# Patient Record
Sex: Female | Born: 1935 | Race: White | Hispanic: No | Smoking: Never smoker
Health system: Southern US, Community
[De-identification: ages and names within clinical notes are randomized; demographics above are authoritative.]

## PROBLEM LIST (undated history)

## (undated) DIAGNOSIS — M47817 Spondylosis without myelopathy or radiculopathy, lumbosacral region: Secondary | ICD-10-CM

## (undated) DIAGNOSIS — I10 Essential (primary) hypertension: Secondary | ICD-10-CM

## (undated) DIAGNOSIS — I959 Hypotension, unspecified: Secondary | ICD-10-CM

## (undated) DIAGNOSIS — K449 Diaphragmatic hernia without obstruction or gangrene: Secondary | ICD-10-CM

## (undated) DIAGNOSIS — I351 Nonrheumatic aortic (valve) insufficiency: Secondary | ICD-10-CM

## (undated) DIAGNOSIS — I340 Nonrheumatic mitral (valve) insufficiency: Secondary | ICD-10-CM

## (undated) DIAGNOSIS — Z860100 Personal history of colon polyps, unspecified: Secondary | ICD-10-CM

## (undated) DIAGNOSIS — R918 Other nonspecific abnormal finding of lung field: Secondary | ICD-10-CM

## (undated) DIAGNOSIS — M810 Age-related osteoporosis without current pathological fracture: Secondary | ICD-10-CM

## (undated) DIAGNOSIS — E785 Hyperlipidemia, unspecified: Secondary | ICD-10-CM

## (undated) DIAGNOSIS — D509 Iron deficiency anemia, unspecified: Secondary | ICD-10-CM

## (undated) DIAGNOSIS — M069 Rheumatoid arthritis, unspecified: Secondary | ICD-10-CM

## (undated) DIAGNOSIS — I2510 Atherosclerotic heart disease of native coronary artery without angina pectoris: Secondary | ICD-10-CM

## (undated) DIAGNOSIS — I110 Hypertensive heart disease with heart failure: Secondary | ICD-10-CM

## (undated) DIAGNOSIS — I509 Heart failure, unspecified: Secondary | ICD-10-CM

## (undated) DIAGNOSIS — F32A Depression, unspecified: Secondary | ICD-10-CM

## (undated) DIAGNOSIS — F329 Major depressive disorder, single episode, unspecified: Secondary | ICD-10-CM

## (undated) DIAGNOSIS — E876 Hypokalemia: Secondary | ICD-10-CM

## (undated) DIAGNOSIS — E871 Hypo-osmolality and hyponatremia: Secondary | ICD-10-CM

## (undated) DIAGNOSIS — I5030 Unspecified diastolic (congestive) heart failure: Secondary | ICD-10-CM

## (undated) DIAGNOSIS — K219 Gastro-esophageal reflux disease without esophagitis: Secondary | ICD-10-CM

## (undated) DIAGNOSIS — M19049 Primary osteoarthritis, unspecified hand: Secondary | ICD-10-CM

## (undated) HISTORY — DX: Gastro-esophageal reflux disease without esophagitis: K21.9

## (undated) HISTORY — PX: LAMINECTOMY: SHX219

## (undated) HISTORY — DX: Unspecified diastolic (congestive) heart failure: I50.30

## (undated) HISTORY — DX: Nonrheumatic aortic (valve) insufficiency: I35.1

## (undated) HISTORY — PX: TOTAL ABDOMINAL HYSTERECTOMY: SHX209

## (undated) HISTORY — DX: Iron deficiency anemia, unspecified: D50.9

## (undated) HISTORY — DX: Age-related osteoporosis without current pathological fracture: M81.0

## (undated) HISTORY — DX: Hypertensive heart disease with heart failure: I11.0

## (undated) HISTORY — DX: Personal history of colonic polyps: Z86.010

## (undated) HISTORY — DX: Depression, unspecified: F32.A

## (undated) HISTORY — DX: Hypo-osmolality and hyponatremia: E87.1

## (undated) HISTORY — DX: Nonrheumatic mitral (valve) insufficiency: I34.0

## (undated) HISTORY — DX: Rheumatoid arthritis, unspecified: M06.9

## (undated) HISTORY — PX: CHOLECYSTECTOMY: SHX55

## (undated) HISTORY — DX: Other nonspecific abnormal finding of lung field: R91.8

## (undated) HISTORY — DX: Hypotension, unspecified: I95.9

## (undated) HISTORY — DX: Essential (primary) hypertension: I10

## (undated) HISTORY — PX: APPENDECTOMY: SHX54

## (undated) HISTORY — PX: CATARACT EXTRACTION: SUR2

## (undated) HISTORY — DX: Heart failure, unspecified: I50.9

## (undated) HISTORY — DX: Primary osteoarthritis, unspecified hand: M19.049

## (undated) HISTORY — PX: OTHER SURGICAL HISTORY: SHX169

## (undated) HISTORY — DX: Major depressive disorder, single episode, unspecified: F32.9

## (undated) HISTORY — DX: Spondylosis without myelopathy or radiculopathy, lumbosacral region: M47.817

## (undated) HISTORY — DX: Hyperlipidemia, unspecified: E78.5

## (undated) HISTORY — DX: Personal history of colon polyps, unspecified: Z86.0100

## (undated) HISTORY — DX: Diaphragmatic hernia without obstruction or gangrene: K44.9

## (undated) HISTORY — DX: Atherosclerotic heart disease of native coronary artery without angina pectoris: I25.10

## (undated) HISTORY — DX: Hypokalemia: E87.6

---

## 2002-03-08 HISTORY — PX: OTHER SURGICAL HISTORY: SHX169

## 2002-12-10 ENCOUNTER — Inpatient Hospital Stay (HOSPITAL_COMMUNITY): Admission: RE | Admit: 2002-12-10 | Discharge: 2002-12-14 | Payer: Self-pay | Admitting: Neurosurgery

## 2002-12-14 ENCOUNTER — Inpatient Hospital Stay (HOSPITAL_COMMUNITY)
Admission: RE | Admit: 2002-12-14 | Discharge: 2002-12-21 | Payer: Self-pay | Admitting: Physical Medicine & Rehabilitation

## 2004-04-04 ENCOUNTER — Ambulatory Visit (HOSPITAL_COMMUNITY): Admission: RE | Admit: 2004-04-04 | Discharge: 2004-04-04 | Payer: Self-pay | Admitting: Neurosurgery

## 2006-12-10 ENCOUNTER — Encounter: Admission: RE | Admit: 2006-12-10 | Discharge: 2006-12-10 | Payer: Self-pay | Admitting: Neurosurgery

## 2006-12-14 ENCOUNTER — Ambulatory Visit (HOSPITAL_COMMUNITY): Admission: RE | Admit: 2006-12-14 | Discharge: 2006-12-15 | Payer: Self-pay | Admitting: Neurosurgery

## 2008-06-01 ENCOUNTER — Encounter: Admission: RE | Admit: 2008-06-01 | Discharge: 2008-06-01 | Payer: Self-pay | Admitting: Neurosurgery

## 2009-01-18 ENCOUNTER — Encounter: Admission: RE | Admit: 2009-01-18 | Discharge: 2009-01-18 | Payer: Self-pay | Admitting: Rheumatology

## 2009-01-22 ENCOUNTER — Inpatient Hospital Stay (HOSPITAL_COMMUNITY): Admission: RE | Admit: 2009-01-22 | Discharge: 2009-01-23 | Payer: Self-pay | Admitting: Neurosurgery

## 2009-03-06 ENCOUNTER — Ambulatory Visit (HOSPITAL_COMMUNITY): Admission: RE | Admit: 2009-03-06 | Discharge: 2009-03-06 | Payer: Self-pay | Admitting: Neurosurgery

## 2010-06-10 LAB — BASIC METABOLIC PANEL
CO2: 27 mEq/L (ref 19–32)
Calcium: 8.7 mg/dL (ref 8.4–10.5)
Chloride: 105 mEq/L (ref 96–112)
Glucose, Bld: 174 mg/dL — ABNORMAL HIGH (ref 70–99)
Sodium: 140 mEq/L (ref 135–145)

## 2010-06-10 LAB — CBC: MCV: 97.4 fL (ref 78.0–100.0)

## 2010-07-21 NOTE — Op Note (Signed)
[name] Parsons, [name] Parsons                ACCOUNT NO.:  [id number]   MEDICAL RECORD NO.:  [id number]          PATIENT TYPE:  AMB   LOCATION:  SDS                          FACILITY:  [facility]   PHYSICIAN:  Cristi Loron, M.D.DATE OF BIRTH:  12-Sep-1919   DATE OF PROCEDURE:  12/14/2006  DATE OF DISCHARGE:                               OPERATIVE REPORT   BRIEF HISTORY:  The patient is an 77 year old white female who has  suffered from approximately 1 month history of severe back pain after a  fall.  She was worked up with x-rays and MRI scan, which demonstrated  acute L1 compression fracture.  I discussed the various treatment  options with the patient and her daughters including surgery.  The  patient has weighed the risks, benefits, and alternatives of surgery and  desired to proceed with an L1 kyphoplasty.   PREOPERATIVE DIAGNOSES:  L1 compression fracture, lumbago.   POSTOPERATIVE DIAGNOSES:  L1 compression fracture, lumbago.   PROCEDURE:  L1 kyphoplasty.   SURGEON:  Cristi Loron, M.D.   ASSISTANT:  None.   ANESTHESIA:  General endotracheal.   ESTIMATED BLOOD LOSS:  Minimal.   SPECIMENS:  None.   DRAINS:  None.   COMPLICATIONS:  None.   DESCRIPTION OF PROCEDURE:  The patient was brought to the operating by  the Anesthesia team.  General endotracheal anesthesia was induced.  The  patient was carefully turned to the prone position on chest rolls.  The  thoracolumbar area was prepared with DuraPrep.  Sterile drapes were  applied.  I injected the area to be incised with Marcaine with  epinephrine solution.  We used biplanar fluoroscopy to help [place] with  where to make the incisions.  We made approximately 1 cm incisions  bilaterally, just lateral to the midline.  Then, under biplanar  fluoroscopic guidance, we cannulated the pedicles with the bone needle.  We then moved the trocar drill up the vertebral body and then placed the  balloon.  We blew up the balloons to  approximately 3 to 4 mL bilaterally  under fluoroscopic guidance.  We then removed the balloon and after we  had mixed the cement and it had hardened adequately, we then placed  proximally approximately 2.5 mL of the glue bilaterally into the cavity  created by the balloon bilaterally under fluoroscopic guidance carefully  checking for extravasation and my placement of the methyl methacrylate.  After we were satisfied with the kyphoplasty, we then removed the bone  fillers, and we got final x-ray, which looked good.  The patient got a  good reduction of her kyphotic fracture.  We then reapproximated the  patient's subcutaneous tissue with interrupted 3-0 Vicryl suture, the  skin with Steri-Strips and benzoin.  The wound was then  coated with bacitracin ointment and sterile dressing was applied.  Drapes were removed.  The patient was subsequently returned to supine  position and extubated by the Anesthesia team and transported to  postanesthesia care unit in stable condition.  All sponge, instrument,  and needle counts were correct at the end of this case.  Cristi Loron, M.D.  Electronically Signed     JDJ/MEDQ  D:  12/14/2006  T:  12/15/2006  Job:  [id number]

## 2010-07-24 NOTE — Discharge Summary (Signed)
   [name], [name] Parsons                          ACCOUNT NO.:  [id number]   MEDICAL RECORD NO.:  [id number]                   PATIENT TYPE:  INP   LOCATION:  3008                                 FACILITY:  [facility]   PHYSICIAN:  Cristi Loron, M.D.            DATE OF BIRTH:  1919/04/15   DATE OF ADMISSION:  12/10/2002  DATE OF DISCHARGE:  12/14/2002                                 DISCHARGE SUMMARY   For full details of this admission please refer to typed History and  Physical.   BRIEF HISTORY:  The patient is an 77 year old white female who suffers from  back and intractable leg pain consistent with neurogenic claudication.  She  failed medical management and was worked up with a lumbar MRI which  demonstrated a grade 1-2 spondylolisthesis L4-5 with severe spinal stenosis  L2-3, L3-4, and L4-5.  I discussed the various treatment options with her  including surgery.  The patient weighed the risks, benefits, and  alternatives of surgery and decided to proceed with a lumbar decompression,  fusion, and instrumentation.   HOSPITAL COURSE:  I admitted the patient to [facility] December 10, 2002.  On the day of admission I performed an L2 and 3 laminectomy with an  L4 Gill procedure and an L4-5 fusion, instrumentation.  The surgery went  well without complications (for full details of this operation please refer  to typed operative note).   POSTOPERATIVE COURSE:  The patient's postoperative course was unremarkable.  By postoperative day #4 the patient was afebrile, vital signs were stable,  and she was eating well, ambulating well.  The wound was healing well  without signs of infection.  She was felt to be stable for discharge to the  inpatient rehab unit.  She was transferred to the rehab unit on December 14, 2002.   DISCHARGE INSTRUCTIONS:  The patient was given written discharge  instructions.  Instructed to up with me in four weeks.   FINAL DIAGNOSES:  1. L4-5 grade 1  acquired spondylolisthesis.  2. Degenerative disease L2-3, L3-4, L4-5.  3. Lumbar radiculopathy.   PROCEDURE PERFORMED:  L4 Gill procedure; L2 and L3 laminectomy to the treat  the stenosis; L4-5 posterior lumbar interbody fusion; placement of bilateral  L4-5 Tangent interbody prosthesis; posterior nonsegment instrumentation with  Legacy titanium pedicle screws and rod; posterolateral arthrodesis with  local morselized autograft bone and __________ bone scaffolding.                                                Cristi Loron, M.D.    JDJ/MEDQ  D:  01/03/2003  T:  01/03/2003  Job:  [id number]

## 2010-07-24 NOTE — Consult Note (Signed)
[name], [name] NO.:  [id number]   MEDICAL RECORD NO.:  [id number]                   PATIENT TYPE:  IPS   LOCATION:  4007                                 FACILITY:  [facility]   PHYSICIAN:  Rozanna Boer., M.D.      DATE OF BIRTH:  [birth year]   DATE OF CONSULTATION:  12/19/2002  DATE OF DISCHARGE:                                   CONSULTATION   REFERRING PHYSICIAN:  Ellwood Dense, M.D.   REASON FOR CONSULTATION:  Postoperative urinary retention.   HISTORY OF PRESENT ILLNESS:  This 77 year old lady is now nine days post L4-  5 laminectomy and has been in urinary retention.  She had a catheter for a  couple of days and been on in and out catheterization for the last week or  so.  She has also been on low dose bethanechol 10 mg t.i.d.  She has had  bilateral total knees in the past, one in 1999 and another in 2003 with  similar problems, but was able to go home voiding each time without a  catheter.  No other voiding problems by history.  No history of urinary  tract infection or hematuria.   She has had a large amount of retained urine in her bladder on several  occasions fairly regularly.  Her PVRs are 600 to 1000 mL or more.  The  bladder just cannot that over distended.  That just can overdistend the  bladder and make it impossible for her to contract to be able to empty.   PHYSICAL EXAMINATION:  She has good perineal sensation.  She is a pleasant  white female lying quietly in bed, actually doing her physical therapy.  Bladder does not appear to be distended, but earlier this morning, had 692  mL.   IMPRESSION:  Postoperative urinary retention post laminectomy.   RECOMMENDATIONS:  1. Increase in and out catheterizations to every four hours if necessary to     keep bladder volume less than 400 mL.  2. Increase Urecholine to 25 mg p.o. t.i.d. but she needs to be put on the     toilet 30 to 60 minutes later to see if she can void  when the maximal     effect of the Urecholine would be.  If she is unable to void, she needs     to be in and out catheterized at that time if needed.  3. Would add Flomax 0.4 mg a day to help relax some of the muscles of the     bladder neck.  4. I will see her again Friday before anticipated discharge.  If this cannot     be done here, then she could just go home with a Foley catheter to give     her bladder tone time to recover while she increases her ambulation.  I     feel certain she will be able to void soon.  Actually this morning, she     went for the first time in the last week on her own, although it was only     a small amount.                                               Rozanna Boer., M.D.    HMK/MEDQ  D:  12/19/2002  T:  12/19/2002  Job:  [id number]

## 2010-07-24 NOTE — Discharge Summary (Signed)
[name] Parsons, [name] Parsons                          ACCOUNT NO.:  [id number]   MEDICAL RECORD NO.:  [id number]                   PATIENT TYPE:  IPS   LOCATION:  4007                                 FACILITY:  [facility]   PHYSICIAN:  Ellwood Dense, M.D.                DATE OF BIRTH:  [birth year]   DATE OF ADMISSION:  12/14/2002  DATE OF DISCHARGE:  12/21/2002                                 DISCHARGE SUMMARY   DISCHARGE DIAGNOSES:  1. Lumbar laminectomy with fusion, L2-3 and L4-5.  2. Pain management.  3. Urinary retention.  4. Rheumatoid arthritis.  5. Gastroesophageal reflux disease.  6. Hypercholesterolemia.  7. Hypertension.  8. History of cardiac arrhythmia.   HISTORY OF PRESENT ILLNESS:  An 77 year old white female admitted on December 10, 2002, with chronic low back, radiating to lower extremities. X-rays and  imaging with L4-5 grade 1 spondylolisthesis. He underwent L4-5 and L2-3  laminectomy, posterior lumbar fusion of L4-5 on December 11, 2002, by Dr.  Lovell Sheehan. Back corset when out of bed.  Postoperative course pain management  noted some urinary retention. She was close supervision for ambulation.  Chemistry is unremarkable. She was admitted for a comprehensive  rehabilitation program.   PAST MEDICAL HISTORY:  See discharge diagnoses.   PAST SURGICAL HISTORY:  Tonsillectomy, appendectomy, cholecystectomy,  bilateral knee replacements, and cataract surgery.   ALLERGIES:  None.   SOCIAL HISTORY:  Denies alcohol or tobacco. Widowed. She lives alone in  [place], one level home, one to two steps to entry. Used a cane walker  prior to admission. Still driving. Daughter assists as needed with meals.   MEDICATIONS:  Prior to admission include methotrexate weekly, Fosamax,  prednisone, folic acid, Os-Cal, Lipitor, Micardis, Celebrex, Lasix,  potassium chloride, Toprol, iron sulfate, Prevacid, and OxyContin. Her  primary MD is Dr. Tomasa Blase of [place]. Her rheumatologist is Dr.  Jimmy Footman.   HOSPITAL COURSE:  Patient with progressive gains while on rehab services  with therapies initiated on a twice daily basis. The following issues were  followed during patient's rehab course. Pertaining to [name]'s lumbar  laminectomy, surgical site healing nicely, Steri-Strips in place. She was  advised a back corset when out of bed. She was supervision independent in  her room with ambulation. She would follow up with Dr. Peggye Ley of  neurosurgery. Home health therapies have been advised. Pain management  ongoing with the use of Tylox and good results.  Rehab hospital course of  urinary retention. Dr. Aldean Ast of urology services consulted on December 19, 2002. She had initially been placed on Urecholine 10 mg t.i.d. and this  was increased to 25 mg t.i.d. as well as addition of Flomax 0.4 mg at  bedtime. Still an inability to void with demand for intermittent  catheterizations, volumes of 400 cc.  She was also treated for an  enterococcus urinary tract infection with amoxicillin for 7  days. Inability  to void. Contacts to Dr. Aldean Ast, advised to discharge home with Foley  catheter tube, to continue Urecholine and Flomax with eventual plan to  follow up in one week with Dr. Aldean Ast in the office for voiding trial  with Foley catheter tube to be removed six hours before follow-up  appointment.  Her blood pressures remain controlled. No orthostatic changes.  Bowel program regulated with laxative assistance. Full family teaching was  completed. It was noted with her rheumatoid arthritis that she would remain  on prednisone as well as her weekly methotrexate and follow up with the  rheumatology services as advised.   LABORATORY DATA:  Hemoglobin 10.1, hematocrit 29.3, WBC 10.8, platelet count  420,000. Sodium 139, potassium 3.5, BUN 13, creatinine 0.6.   DISCHARGE MEDICATIONS:  1. Ferrous sulfate daily.  2. Folic acid daily.  3. Lasix 40 mg daily.  4.  Micardis daily.  5. Toprol XL 25 mg daily.  6. Potassium chloride 10 mEq daily.  7. Prednisone 5 mg daily.  8. Lipitor 40 mg at bedtime.  9. Prevacid 30 mg daily.  10.      Methotrexate weekly.  11.      Tylox one to two tablets q.4h. p.r.n. pain.  12.      Urecholine 25 mg t.i.d.  13.      Flomax 0.4 mg at bedtime.  14.      Amoxicillin 250 mg t.i.d. until December 24, 2002, and stop.   ACTIVITY:  Back corset when out of bed.   DIET:  Regular.   WOUND CARE:  Cleanse incision daily with warm soap and water.   SPECIAL INSTRUCTION:  No driving.   FOLLOWUP:  The patient is advised to follow up with Dr. Aldean Ast, [phone],  in one week for voiding trial, remove Foley catheter tube six hours before  appointment.  Follow up with Dr. Cristi Loron, neurosurgery, [phone],  in one week. Follow up with Dr. Veatrice Kells for medical management.      Mariam Dollar, P.A.                     Ellwood Dense, M.D.    DA/MEDQ  D:  12/21/2002  T:  12/21/2002  Job:  [id number]   cc:   Ellwood Dense, M.D.  [address] [place]  [place] [address]  Fax: [phone]   Cristi Loron, M.D.  [address]  [place]  [place] [address]  Fax: [phone]   Lemmie Evens, M.D.  [address]  [place]  [place] [address]  Fax: [phone]   Rozanna Boer., M.D.  [address]  [place]  [place] [address]  Fax: [phone]

## 2010-07-24 NOTE — Op Note (Signed)
[name] Parsons, [name] Parsons                          ACCOUNT NO.:  [id number]   MEDICAL RECORD NO.:  [id number]                   PATIENT TYPE:  INP   LOCATION:  3103                                 FACILITY:  [facility]   PHYSICIAN:  Cristi Loron, M.D.            DATE OF BIRTH:  1919/10/20   DATE OF PROCEDURE:  DATE OF DISCHARGE:  12/10/2002                                 OPERATIVE REPORT   PREOPERATIVE DIAGNOSES:  1. L4-5 grade I equinus spondylolisthesis.  2. Degenerative disk disease, L2-3, L3-4, L4-5.  3. Lumbar radiculopathy.   POSTOPERATIVE DIAGNOSES:  1. L4-5 grade I equinus spondylolisthesis.  2. Degenerative disk disease, L2-3, L3-4, L4-5.  3. Lumbar radiculopathy.   PROCEDURE:  L4 Gill procedure to treat the spondylolisthesis at L4-5;  L2  and L3 laminectomy to treat the stenosis at L2-3 and L3-4; L4-5 posterior  lumbar interbody fusion; placement of mild L4-5 Tangent interbody  prosthesis; posterior nonsegmental instrumentation with Legacy titanium  products, screws and rods; posterior lateral arthrodesis with local  morselized autograft bone and decalcified bone scaffolding.   SURGEON:  Cristi Loron, M.D.   ASSISTANT:  Danae Orleans. Venetia Maxon, M.D.   ANESTHESIA:  General endotracheal.   ESTIMATED BLOOD LOSS:  300 mL.   SPECIMENS:  None.   DRAINS:  None.   COMPLICATIONS:  None.   INDICATIONS:  The patient is an 77 year old white female who suffers from  back and intractable leg pain consistent with neurogenic claudication.  She  failed medical management and was worked up with a lumbar MRI which  demonstrated a grade I-II spondylolisthesis at L4-5 with severe spinal  stenosis at L2-3, L3-4 and L4-5.  I discussed the various treatment options  with the patient including surgery.  The patient understands the risks and  alternatives of surgery and decided to proceed with a lumbar decompression  and fusion and instrumentation.   DESCRIPTION OF PROCEDURE:  The  patient was brought to the operating room and  anesthesia was achieved.  General endotracheal anesthesia was induced.  The  patient was then turned in a prone position on the chest rolls.  The  lumbosacral region was then prepared with Betadine scrub and Betadine  solution.  Sterile drapes were applied.  I then injected the area to be  incised with Marcaine with epinephrine solution with the scalpel to make a  linear midline incision over the L2 to L4 spinous processes.  I used the  electrocautery to dissect down to the thoracolumbar fascia, divided the  fascia bilaterally, performing bilateral subperiosteal dissection, stripping  the spinous musculature from the spinous process of the lamina at L2, L3, L4  and L5 with the McCullough retractor for exposure.  I then obtained  intraoperative radiographs to confirm our location.  We then used the  scalpel to incise the interspinous ligament at L1-2, L2-3, L3-4, and L4-5.  I then used the Leksell rongeur to  remove the spinous process and part of  the lamina at L2, L3 and L4.  This bone was saved and rendered clear of soft  tissue to use in the fusion process.  We then used the high speed drill to  perform bilateral L2, L3 and L4 laminotomies.  We then completed the L4 Gill  procedure with the Kerrison punch, removing the L4-5 ligament of flavum and  the remainder of the L4 lamina, the abnormal medial aspect of the pars  region at facet joints and then continued in a cephalad direction removing  the L3-4 ligament of flavum and L3 lamina, L2-3 ligament of flavum, the L2  lamina and L1-L2 ligament of flavum.  We then used the Kerrison punch to  decompress the lateral recesses performing a foraminotomy about the L3, L4  and L5 nerve roots bilaterally getting a good decompression.  We then  inspected the greater tuberosity at L2-3 and L3-4 knowing they were bulging,  but there was no significant neural compression.  The L4-5 level had  significant  spondylolisthesis.   We then freed up the thecal sac and the L5 nerve roots and the epidural  tissues.  We gently retracted medially with the Derrico retractor.  We  incised the L4-5 vertebral disk and performed aggressive diskectomy using  the pituitary forceps and the Epstein curets.  We did this bilaterally.  We  then distracted the interspace and with the interbody spreader we then  prepared the cut lateral disk space with the rotating cutter and the chisel  and inserted an 8 x 26 mm Tangent bone prosthesis into the interspace, of  course, after dragging the thecal sac and nerves root out of harm's way.  We  then removed the struts from the other side and repeated this procedure  using another Tangent prosthesis on the other side.  We packed in between  the prosthesis with a combination of local morselized autograft bone and  Vitoss bone scaffolding completing the posterior lumbar interbody fusion.   We then turned our attention to the nonsegmental instrumentation.  We used  the high speed drill to decorticate posterior to the body of L4 and L5  pedicles and under fluoroscopic guidance we cannulated the pedicles with  bone plug, tapped the pedicles with the ball probe where we got cortical  breeches.  I inserted a 6.5 x 50 mm       pedicle screw bilaterally at L4, a  6.4 x 45 mm pedicle screw bilaterally at L5.  We then palpated along the  medial aspect of the L4-5 pedicles and noted that there were no cortical  breeches at the L4-5 nerve roots were not entered.  We then connected the  unilateral pedicle screws with the lordotic rod and then secured them in  place using the appropriate break off caps.  This completed the posterior  nonsegmented instrumentation.   We then turned our attention to the posterior lateral arthrodesis.  We used  the high speed drill to decorticate the remainder of the L4-5 facet, pars region to transverse process of L4 and L5.  We then laid a combination  of  local morselized autograft bone and Vitoss bone scaffolding over these two  decorticated posterior lateral structures completing the posterior lateral  arthrodesis.   We then inspected the thecal sac and the L4 and L5 nerve roots had been well  decompressed.  I should mention that we performed a generous foraminotomy at  the bilateral L4 nerve root, decompressing  this stenotic foramen.  At this  point, we had a good decompression.  We copiously irrigated the wound out  with Bacitracin solution and removed the solution.  We obtained stringent  hemostasis using bipolar electrocautery.  We then removed the [facility]  retractor.  We then reapproximated the patient's thoracolumbar fascia with  interrupted #1 Vicryl suture, subcutaneous tissue with interrupted 2-0  Vicryl suture.  The wound was then covered with Bacitracin ointment.  A  sterile dressing was applied.  The drapes were removed.  The patient was  returned to the supine position where she was extubated by the anesthesia  team and transported to the post anesthesia care unit in stable condition.  All sponge, needle and instrument counts were correct at the end of this  case.                                               Cristi Loron, M.D.   JDJ/MEDQ  D:  12/10/2002  T:  12/11/2002  Job:  [id number]

## 2010-10-03 ENCOUNTER — Inpatient Hospital Stay (INDEPENDENT_AMBULATORY_CARE_PROVIDER_SITE_OTHER)
Admission: RE | Admit: 2010-10-03 | Discharge: 2010-10-03 | Disposition: A | Discharge status: Home / Self Care | Payer: Medicare Other | Source: Ambulatory Visit | Attending: Family Medicine | Admitting: Family Medicine

## 2010-10-03 ENCOUNTER — Emergency Department (HOSPITAL_COMMUNITY)
Admission: EM | Admit: 2010-10-03 | Discharge: 2010-10-03 | Disposition: A | Discharge status: Home / Self Care | Payer: Medicare Other | Attending: Emergency Medicine | Admitting: Emergency Medicine

## 2010-10-03 ENCOUNTER — Emergency Department (HOSPITAL_COMMUNITY): Payer: Medicare Other

## 2010-10-03 DIAGNOSIS — S0990XA Unspecified injury of head, initial encounter: Secondary | ICD-10-CM | POA: Insufficient documentation

## 2010-10-03 DIAGNOSIS — S0180XA Unspecified open wound of other part of head, initial encounter: Secondary | ICD-10-CM

## 2010-10-03 DIAGNOSIS — Z79899 Other long term (current) drug therapy: Secondary | ICD-10-CM | POA: Insufficient documentation

## 2010-10-03 DIAGNOSIS — K219 Gastro-esophageal reflux disease without esophagitis: Secondary | ICD-10-CM | POA: Insufficient documentation

## 2010-10-03 DIAGNOSIS — I10 Essential (primary) hypertension: Secondary | ICD-10-CM | POA: Insufficient documentation

## 2010-10-03 DIAGNOSIS — Y92009 Unspecified place in unspecified non-institutional (private) residence as the place of occurrence of the external cause: Secondary | ICD-10-CM | POA: Insufficient documentation

## 2010-10-03 DIAGNOSIS — E7800 Pure hypercholesterolemia, unspecified: Secondary | ICD-10-CM | POA: Insufficient documentation

## 2010-10-03 DIAGNOSIS — W19XXXA Unspecified fall, initial encounter: Secondary | ICD-10-CM

## 2010-10-03 LAB — POCT I-STAT, CHEM 8
BUN: 23 mg/dL (ref 6–23)
Chloride: 100 mEq/L (ref 96–112)
TCO2: 32 mmol/L (ref 0–100)

## 2010-10-03 LAB — TROPONIN I: Troponin I: <0.3 ng/mL (ref <0.30)

## 2010-12-17 LAB — CBC
Hemoglobin: 12.4
MCHC: 33
RDW: 17.2 — ABNORMAL HIGH

## 2010-12-17 LAB — BASIC METABOLIC PANEL
BUN: 15
CO2: 27
Calcium: 8.7
Creatinine, Ser: 0.75
GFR calc Af Amer: >60
Glucose, Bld: 110 — ABNORMAL HIGH
Potassium: 4

## 2011-05-07 DIAGNOSIS — Z79899 Other long term (current) drug therapy: Secondary | ICD-10-CM | POA: Diagnosis not present

## 2011-05-07 DIAGNOSIS — I10 Essential (primary) hypertension: Secondary | ICD-10-CM | POA: Diagnosis not present

## 2011-05-07 DIAGNOSIS — D509 Iron deficiency anemia, unspecified: Secondary | ICD-10-CM | POA: Diagnosis not present

## 2011-05-07 DIAGNOSIS — I2510 Atherosclerotic heart disease of native coronary artery without angina pectoris: Secondary | ICD-10-CM | POA: Diagnosis not present

## 2011-05-07 DIAGNOSIS — E785 Hyperlipidemia, unspecified: Secondary | ICD-10-CM | POA: Diagnosis not present

## 2011-05-07 DIAGNOSIS — IMO0002 Reserved for concepts with insufficient information to code with codable children: Secondary | ICD-10-CM | POA: Diagnosis not present

## 2011-05-07 DIAGNOSIS — D508 Other iron deficiency anemias: Secondary | ICD-10-CM | POA: Diagnosis not present

## 2011-05-10 DIAGNOSIS — M24576 Contracture, unspecified foot: Secondary | ICD-10-CM | POA: Diagnosis not present

## 2011-05-10 DIAGNOSIS — M79609 Pain in unspecified limb: Secondary | ICD-10-CM | POA: Diagnosis not present

## 2011-05-10 DIAGNOSIS — L84 Corns and callosities: Secondary | ICD-10-CM | POA: Diagnosis not present

## 2011-05-26 DIAGNOSIS — M810 Age-related osteoporosis without current pathological fracture: Secondary | ICD-10-CM | POA: Diagnosis not present

## 2011-05-26 DIAGNOSIS — M069 Rheumatoid arthritis, unspecified: Secondary | ICD-10-CM | POA: Diagnosis not present

## 2011-07-15 DIAGNOSIS — I509 Heart failure, unspecified: Secondary | ICD-10-CM | POA: Diagnosis not present

## 2011-07-15 DIAGNOSIS — I10 Essential (primary) hypertension: Secondary | ICD-10-CM | POA: Diagnosis not present

## 2011-07-15 DIAGNOSIS — R51 Headache: Secondary | ICD-10-CM | POA: Diagnosis not present

## 2011-07-15 DIAGNOSIS — R5381 Other malaise: Secondary | ICD-10-CM | POA: Diagnosis not present

## 2011-07-15 DIAGNOSIS — R5383 Other fatigue: Secondary | ICD-10-CM | POA: Diagnosis not present

## 2011-07-15 DIAGNOSIS — R0602 Shortness of breath: Secondary | ICD-10-CM | POA: Diagnosis not present

## 2011-07-15 DIAGNOSIS — K219 Gastro-esophageal reflux disease without esophagitis: Secondary | ICD-10-CM | POA: Diagnosis not present

## 2011-07-15 DIAGNOSIS — R52 Pain, unspecified: Secondary | ICD-10-CM | POA: Diagnosis not present

## 2011-07-15 DIAGNOSIS — E7800 Pure hypercholesterolemia, unspecified: Secondary | ICD-10-CM | POA: Diagnosis not present

## 2011-07-21 DIAGNOSIS — R5381 Other malaise: Secondary | ICD-10-CM | POA: Diagnosis not present

## 2011-07-21 DIAGNOSIS — IMO0002 Reserved for concepts with insufficient information to code with codable children: Secondary | ICD-10-CM | POA: Diagnosis not present

## 2011-07-21 DIAGNOSIS — H6120 Impacted cerumen, unspecified ear: Secondary | ICD-10-CM | POA: Diagnosis not present

## 2011-07-21 DIAGNOSIS — R51 Headache: Secondary | ICD-10-CM | POA: Diagnosis not present

## 2011-08-17 DIAGNOSIS — I951 Orthostatic hypotension: Secondary | ICD-10-CM | POA: Diagnosis not present

## 2011-08-17 DIAGNOSIS — I452 Bifascicular block: Secondary | ICD-10-CM | POA: Diagnosis not present

## 2011-08-17 DIAGNOSIS — I5030 Unspecified diastolic (congestive) heart failure: Secondary | ICD-10-CM | POA: Diagnosis not present

## 2011-08-17 DIAGNOSIS — I10 Essential (primary) hypertension: Secondary | ICD-10-CM | POA: Diagnosis not present

## 2011-08-18 DIAGNOSIS — D509 Iron deficiency anemia, unspecified: Secondary | ICD-10-CM | POA: Diagnosis not present

## 2011-08-18 DIAGNOSIS — E785 Hyperlipidemia, unspecified: Secondary | ICD-10-CM | POA: Diagnosis not present

## 2011-08-18 DIAGNOSIS — I10 Essential (primary) hypertension: Secondary | ICD-10-CM | POA: Diagnosis not present

## 2011-08-18 DIAGNOSIS — M069 Rheumatoid arthritis, unspecified: Secondary | ICD-10-CM | POA: Diagnosis not present

## 2011-09-24 DIAGNOSIS — M810 Age-related osteoporosis without current pathological fracture: Secondary | ICD-10-CM | POA: Diagnosis not present

## 2011-09-24 DIAGNOSIS — M069 Rheumatoid arthritis, unspecified: Secondary | ICD-10-CM | POA: Diagnosis not present

## 2011-12-03 DIAGNOSIS — I10 Essential (primary) hypertension: Secondary | ICD-10-CM | POA: Diagnosis not present

## 2011-12-03 DIAGNOSIS — E785 Hyperlipidemia, unspecified: Secondary | ICD-10-CM | POA: Diagnosis not present

## 2011-12-03 DIAGNOSIS — D509 Iron deficiency anemia, unspecified: Secondary | ICD-10-CM | POA: Diagnosis not present

## 2011-12-03 DIAGNOSIS — M069 Rheumatoid arthritis, unspecified: Secondary | ICD-10-CM | POA: Diagnosis not present

## 2011-12-25 DIAGNOSIS — Z23 Encounter for immunization: Secondary | ICD-10-CM | POA: Diagnosis not present

## 2012-01-27 DIAGNOSIS — M810 Age-related osteoporosis without current pathological fracture: Secondary | ICD-10-CM | POA: Diagnosis not present

## 2012-01-27 DIAGNOSIS — M069 Rheumatoid arthritis, unspecified: Secondary | ICD-10-CM | POA: Diagnosis not present

## 2012-03-06 DIAGNOSIS — I951 Orthostatic hypotension: Secondary | ICD-10-CM | POA: Diagnosis not present

## 2012-03-06 DIAGNOSIS — I10 Essential (primary) hypertension: Secondary | ICD-10-CM | POA: Diagnosis not present

## 2012-03-06 DIAGNOSIS — E785 Hyperlipidemia, unspecified: Secondary | ICD-10-CM | POA: Diagnosis not present

## 2012-03-06 DIAGNOSIS — I5030 Unspecified diastolic (congestive) heart failure: Secondary | ICD-10-CM | POA: Diagnosis not present

## 2012-03-07 DIAGNOSIS — R918 Other nonspecific abnormal finding of lung field: Secondary | ICD-10-CM | POA: Diagnosis not present

## 2012-03-07 DIAGNOSIS — I10 Essential (primary) hypertension: Secondary | ICD-10-CM | POA: Diagnosis not present

## 2012-03-07 DIAGNOSIS — I509 Heart failure, unspecified: Secondary | ICD-10-CM | POA: Diagnosis not present

## 2012-03-07 DIAGNOSIS — R296 Repeated falls: Secondary | ICD-10-CM | POA: Diagnosis not present

## 2012-03-07 DIAGNOSIS — M79609 Pain in unspecified limb: Secondary | ICD-10-CM | POA: Diagnosis not present

## 2012-03-07 DIAGNOSIS — M25559 Pain in unspecified hip: Secondary | ICD-10-CM | POA: Diagnosis not present

## 2012-03-07 DIAGNOSIS — R911 Solitary pulmonary nodule: Secondary | ICD-10-CM | POA: Diagnosis not present

## 2012-03-07 DIAGNOSIS — K219 Gastro-esophageal reflux disease without esophagitis: Secondary | ICD-10-CM | POA: Diagnosis not present

## 2012-03-07 DIAGNOSIS — E7800 Pure hypercholesterolemia, unspecified: Secondary | ICD-10-CM | POA: Diagnosis not present

## 2012-03-07 DIAGNOSIS — Z79899 Other long term (current) drug therapy: Secondary | ICD-10-CM | POA: Diagnosis not present

## 2012-03-09 DIAGNOSIS — D509 Iron deficiency anemia, unspecified: Secondary | ICD-10-CM | POA: Diagnosis not present

## 2012-03-09 DIAGNOSIS — R918 Other nonspecific abnormal finding of lung field: Secondary | ICD-10-CM | POA: Diagnosis not present

## 2012-03-09 DIAGNOSIS — I10 Essential (primary) hypertension: Secondary | ICD-10-CM | POA: Diagnosis not present

## 2012-03-09 DIAGNOSIS — IMO0002 Reserved for concepts with insufficient information to code with codable children: Secondary | ICD-10-CM | POA: Diagnosis not present

## 2012-03-21 DIAGNOSIS — R911 Solitary pulmonary nodule: Secondary | ICD-10-CM | POA: Diagnosis not present

## 2012-03-21 DIAGNOSIS — K449 Diaphragmatic hernia without obstruction or gangrene: Secondary | ICD-10-CM | POA: Diagnosis not present

## 2012-03-23 DIAGNOSIS — D509 Iron deficiency anemia, unspecified: Secondary | ICD-10-CM | POA: Diagnosis not present

## 2012-03-23 DIAGNOSIS — IMO0002 Reserved for concepts with insufficient information to code with codable children: Secondary | ICD-10-CM | POA: Diagnosis not present

## 2012-03-23 DIAGNOSIS — I10 Essential (primary) hypertension: Secondary | ICD-10-CM | POA: Diagnosis not present

## 2012-03-23 DIAGNOSIS — R918 Other nonspecific abnormal finding of lung field: Secondary | ICD-10-CM | POA: Diagnosis not present

## 2012-03-23 DIAGNOSIS — E785 Hyperlipidemia, unspecified: Secondary | ICD-10-CM | POA: Diagnosis not present

## 2012-03-27 ENCOUNTER — Other Ambulatory Visit: Payer: Self-pay

## 2012-03-27 ENCOUNTER — Institutional Professional Consult (permissible substitution) (INDEPENDENT_AMBULATORY_CARE_PROVIDER_SITE_OTHER): Payer: Medicare Other | Admitting: Cardiothoracic Surgery

## 2012-03-27 VITALS — BP 110/57 | HR 60 | Resp 16 | Ht 60.0 in | Wt 112.0 lb

## 2012-03-27 DIAGNOSIS — I5032 Chronic diastolic (congestive) heart failure: Secondary | ICD-10-CM | POA: Insufficient documentation

## 2012-03-27 DIAGNOSIS — R918 Other nonspecific abnormal finding of lung field: Secondary | ICD-10-CM

## 2012-03-27 DIAGNOSIS — M810 Age-related osteoporosis without current pathological fracture: Secondary | ICD-10-CM | POA: Insufficient documentation

## 2012-03-27 DIAGNOSIS — M069 Rheumatoid arthritis, unspecified: Secondary | ICD-10-CM | POA: Insufficient documentation

## 2012-03-27 DIAGNOSIS — I10 Essential (primary) hypertension: Secondary | ICD-10-CM | POA: Insufficient documentation

## 2012-03-27 DIAGNOSIS — E785 Hyperlipidemia, unspecified: Secondary | ICD-10-CM | POA: Insufficient documentation

## 2012-03-27 DIAGNOSIS — K449 Diaphragmatic hernia without obstruction or gangrene: Secondary | ICD-10-CM | POA: Insufficient documentation

## 2012-03-27 DIAGNOSIS — I2510 Atherosclerotic heart disease of native coronary artery without angina pectoris: Secondary | ICD-10-CM | POA: Insufficient documentation

## 2012-03-27 HISTORY — DX: Other nonspecific abnormal finding of lung field: R91.8

## 2012-03-27 NOTE — Progress Notes (Signed)
[address]            [place] [address]          [phone]      [name] Parsons [facility] Medical Record #[id number] Date of Birth: 1919/07/29  Referring: Paulina Fusi, MD Primary Care: Paulina Fusi, MD  Chief Complaint:    Chief Complaint  Patient presents with  . Lung Lesion    bilateral...eval and treat...ct and PET @ [facility]    History of Present Illness:    Patient is a 79 year old female with no previous history of malignancy or history of smoking in the past. She fell at home in late December and scraped her right arm. While in the emergency room in [place] a chest x-ray was obtained that showed pulmonary nodules a subsequent CT scan of the chest and PET scan were obtained. The patient is referred for asymptomatic multiple pulmonary nodules. She does have a long history of rheumatoid arthritis and has been on prednisone started taking September 2013 she is followed by Dr. Azzie Roup for her rheumatoid arthritis on methotrexate and prednisone. She denies any fever or chills or cough. She's had no hemoptysis.       Current Activity/ Functional Status: Patient is independent with mobility/ambulation, transfers, ADL's, IADL's.   Past Medical History  Diagnosis Date  . Multiple pulmonary nodules 03/27/2012  . Hiatal hernia   . Rheumatoid arthritis   . Hypertension   . Hyperlipidemia   . Osteoporosis   . CAD (coronary artery disease)   . Spondylosis, lumbosacral   . Hypopotassemia   . GERD (gastroesophageal reflux disease)   . Depression   . Hypotension   . Hyponatremia   . Congestive heart failure   . Osteoarthrosis, hand   . Anemia, iron deficiency   . Diastolic heart failure   . Hypopotassemia   . Hx of colonic polyps     Past Surgical History  Procedure Date  . Appendectomy   . Total abdominal hysterectomy   . Cholecystectomy   . Spinal stenosis surgery 2004  . Laminectomy     lumber with  fusion  . Cataract extraction     insert prosthetic lens  . Hyperplastic polyp removed 201    Dr Jennye Boroughs    Family History  Problem Relation Age of Onset  . Diabetes Mother   . Diabetes Sister   . Diabetes Maternal Grandfather     History   Social History  . Marital Status: Widowed    Spouse Name: N/A    Number of Children: N/A  . Years of Education: N/A   Occupational History  . retired    Social History Main Topics  . Smoking status: Never Smoker   . Smokeless tobacco: Never Used  . Alcohol Use: No  . Drug Use: No  . Sexually Active: Not on file   Other Topics Concern  . Not on file   Social History Narrative  . No narrative on file    History  Smoking status  . Never Smoker   Smokeless tobacco  . Never Used    History  Alcohol Use No     Allergies  Allergen Reactions  . Iohexol      Code: HIVES, Desc: PT DEVELOPED HIVES AFTER GETTING CONTRAST MEDIA;THIS OCCURED "SEVERAL" YRS. AGO.CT NOTIFIED 03/05/09!, Onset Date: [id number]     Current Outpatient Prescriptions  Medication Sig Dispense Refill  . acetaminophen (TYLENOL) 500 MG tablet Take 500 mg by mouth every 6 (six) hours as needed.      Marland Kitchen alendronate (FOSAMAX) 70 MG tablet Take 70 mg by mouth every 7 (seven) days.       Marland Kitchen atorvastatin (LIPITOR) 40 MG tablet Take 40 mg by mouth daily.      . bethanechol (URECHOLINE) 25 MG tablet Take 25 mg by mouth 3 (three) times daily.      . Calcium-Vitamin D 600-125 MG-UNIT TABS Take by mouth daily.      . cyclobenzaprine (FLEXERIL) 10 MG tablet Take 10 mg by mouth at bedtime.       . ferrous gluconate (FERGON) 325 MG tablet Take 325 mg by mouth 2 (two) times daily.      . folic acid (FOLVITE) 1 MG tablet Take 1 mg by mouth daily.      . furosemide (LASIX) 40 MG tablet Take 40 mg by mouth 2 (two) times daily.      Marland Kitchen HYDROcodone-acetaminophen (NORCO) 10-325 MG per tablet Take 1 tablet by mouth every 6 (six) hours as needed.      . loperamide (IMODIUM) 2 MG  capsule Take 2 mg by mouth 4 (four) times daily as needed.      Marland Kitchen Lysine HCl 500 MG TABS Take by mouth daily.      . methotrexate (RHEUMATREX) 2.5 MG tablet Take 10 mg by mouth once a week.       . Multiple Vitamin (MULTIVITAMIN) capsule Take 1 capsule by mouth daily.      . Omega-3 Fatty Acids (FISH OIL) 1000 MG CAPS Take by mouth daily.      Marland Kitchen omeprazole (PRILOSEC) 20 MG capsule Take 20 mg by mouth daily.      . potassium chloride SA (K-DUR,KLOR-CON) 20 MEQ tablet Take 20 mEq by mouth daily.       . predniSONE (DELTASONE) 5 MG tablet Take 5 mg by mouth daily.       . psyllium (METAMUCIL) 0.52 G capsule Take 0.52 g by mouth daily.      . ranitidine (ZANTAC) 150 MG tablet Take 150 mg by mouth 2 (two) times daily.      . Tamsulosin HCl (FLOMAX) 0.4 MG CAPS Take 0.4 mg by mouth daily.           Review of Systems:     Cardiac Review of Systems: Y or N  Chest Pain [  n  ]  Resting SOB [ n  ] Exertional SOB  [ y ]  Pollyann Kennedy Milo.Brash  ]   Pedal Edema [ mild  ]    Palpitations [ n ] Syncope  [  n]   Presyncope [n   ]  General Review of Systems: [Y] = yes [  ]=no Constitional: recent weight change [  ]; anorexia [  ]; fatigue Cove.Etienne  ]; nausea [n  ]; night sweats [n  ]; fever [ n ]; or chills [ n ];  Dental: poor dentition[ n ]; Last Dentist visit:   Eye : blurred vision [  ]; diplopia [   ]; vision changes [  ];  Amaurosis fugax[  ]; Resp: cough [ n ];  wheezing[n  ];  hemoptysis[n  ]; shortness of breath[ y ]; paroxysmal nocturnal dyspnea[  ]; dyspnea on exertion[y  ]; or orthopnea[  ];  GI:  gallstones[  ], vomiting[  ];  dysphagia[  ]; melena[  ];  hematochezia [  ]; heartburn[  ];   Hx of  Colonoscopy[ y ]; GU: kidney stones [  ]; hematuria[  ];   dysuria [  ];  nocturia[  ];  history of     obstruction [  ];             Skin: rash, swelling[  ];, hair loss[  ];   peripheral edema[  ];  or itching[  ]; Musculosketetal: myalgias[  y];  joint swelling[ y ];  joint erythema[ y ];  joint pain[y ];  back pain[ y ];  Heme/Lymph: bruising[  ];  bleeding[  ];  anemia[  ];  Neuro: TIA[  ];  headaches[  ];  stroke[  ];  vertigo[  ];  seizures[  ];   paresthesias[  ];  difficulty walking[  ];  Psych:depression[  ]; anxiety[  ];  Endocrine: diabetes[  ];  thyroid dysfunction[  ];  Immunizations: Flu [  ]; Pneumococcal[  ];  Other:  Physical Exam: BP 110/57  Pulse 60  Resp 16  Ht 5' (1.524 m)  Wt 112 lb (50.803 kg)  BMI 21.87 kg/m2  SpO2 96%  General appearance: alert, cooperative, appears stated age and no distress Neurologic: intact Heart: regular rate and rhythm, S1, S2 normal, no murmur, click, rub or gallop and normal apical impulse Lungs: clear to auscultation bilaterally and normal percussion bilaterally Abdomen: soft, non-tender; bowel sounds normal; no masses,  no organomegaly Extremities: extremities normal, atraumatic, no cyanosis or edema and Homans sign is negative, no sign of DVT I do not appreciate any cervical or supra-clavicular adenopathy   Diagnostic Studies & Laboratory data:     Recent Radiology Findings:  02/2012:  RADIOLOGY REPORT*  Clinical Data: Evaluate pulmonary nodules suspected on recent chest x-ray.  CT CHEST WITHOUT CONTRAST  Technique: Multidetector CT imaging of the chest was performed following the standard protocol without IV contrast.  Comparison: Chest x-ray 03/07/2012.  Findings: The chest wall is unremarkable. No obvious breast masses, supraclavicular or axillary lymphadenopathy. Thyroid gland is mildly enlarged and somewhat nodular likely reflecting thyroid goiter. No discrete nodule or mass. The bony thorax is intact. No destructive bone lesions or spinal canal compromise. Vertebral augmentation changes are noted in the L1 vertebral body.  The heart is normal in size for age. No pericardial  effusion. No mediastinal or hilar lymphadenopathy. Dense atherosclerotic calcifications involving the aorta and branch vessels. No focal aneurysm. There is a large hiatal hernia.  Examination of the lung parenchyma confirms multiple pulmonary nodules much greater in the left lung. Ill-defined and somewhat irregular margins. Findings could be due to septic emboli or metastatic pulmonary disease. No focal infiltrates, edema or effusions. The tracheobronchial tree is grossly normal.  The upper abdomen is unremarkable.  IMPRESSION:  1. Multiple bilateral pulmonary nodules suspicious for metastatic disease. Septic emboli also possible but much less likely. 2. No mediastinal or hilar lymphadenopathy. 3. Large hiatal hernia.  PET 03/2012: NUCLEAR MEDICINE PET SKULL BASE TO THIGH  Fasting Blood  Glucose: 90  Technique: 13 mCi F-18 FDG was injected intravenously. CT data was obtained and used for attenuation correction and anatomic localization only. (This was not acquired as a diagnostic CT examination.) Additional exam technical data entered on technologist worksheet.  Comparison: 03/07/2012  Findings:  Neck: No hypermetabolic lymph nodes within the neck. Diffuse increased uptake is identified throughout both lobes of the thyroid gland.  Chest: Left upper lobe nodule measures 1.1 cm and has an SUV max equal to 2.8, image 82. The subpleural left upper lobe nodule measures 1.6 cm and has an SUV max equal to 4.6, image 71. Hypermetabolic nodule in the right apex measures 8 mm and has an SUV max equal to 2.19, image 66. Right upper lobe nodule measures 4 mm. Too small to characterize. The superior segment of the left lower lobe nodule measures 3 mm, image 75. This is also too small to characterize. No hypermetabolic mediastinal or hilar lymph nodes. There is a large hiatal hernia. The heart size is enlarged. No pericardial effusion. Prominent coronary artery calcifications involve  the left main, LAD, left circumflex and RCA coronary arteries.  Abdomen/Pelvis: No abnormal hypermetabolic activity within the liver, pancreas, adrenal glands, or spleen. No hypermetabolic lymph nodes in the abdomen or pelvis.  Skeleton: No focal hypermetabolic activity to suggest skeletal metastasis.  IMPRESSION:  1. There are three nodules within the left upper lobe which exhibit malignant range FDG uptake and are worrisome for multi centric bronchogenic carcinoma. 2. Prominent coronary artery calcifications. 3. Diffuse uptake throughout the thyroid gland suspicious for a thyroiditis. 4. Large hiatal hernia.   Original Report Authenticated By: Signa Kell, M.D.       02/2009 Clinical Data: Suspicion for left upper lobe nodule on CXR  CT CHEST WITH CONTRAST  Technique: Multidetector CT imaging of the chest was performed  following the standard protocol during bolus administration of  intravenous contrast.  Contrast: 100 ml Omnipaque-300 IV. The patient received 13 hour  prep for hive allergy.  Comparison: CT 01/22/2009  Findings: The previously noted 7 x 16 mm nodular focus in the  posterior aspect left upper lobe has resolved. There is a  calcified granuloma in the posterior segment of the left upper  lobe. No change in two small nodules in the lateral aspect of the  right middle lobe (image 34). They measure approximately 5 mm in  diameter. Stable 3 x 4 mm subpleural nodule in the right upper  lobe (image 20). Stable small ground-glass opacity in the left  midlung field of (image 26).  Large hiatal hernia. Thyroid gland is again noted to be prominent  in size. No pathologically enlarged lymph nodes. Slight  calcification of the LAD coronary artery branch. Mild biliary  ductal dilatation. This was not present on the prior exam.  Recommend correlation with LFTs.  IMPRESSION:  Resolution of the previously noted nodular lesion in the left upper  lobe. Stable small  nodules described above. Mild biliary ductal  dilatation. Recommend correlation with LFTs.   Recent Lab Findings: Lab Results  Component Value Date   WBC 9.9 01/21/2009   HGB 14.3 10/03/2010   HCT 42.0 10/03/2010   PLT 266 01/21/2009   GLUCOSE 105* 10/03/2010   NA 141 10/03/2010   K 3.6 10/03/2010   CL 100 10/03/2010   CREATININE 1.20* 10/03/2010   BUN 23 10/03/2010   CO2 27 01/21/2009      Assessment / Plan:   1  Multiple bilateral pulmonary nodules suspicious for metastatic  disease 2 Large hiatal hernia 3 Prominent coronary artery calcifications. 4  Diffuse uptake throughout the thyroid gland suspicious for a thyroiditis. 5 Rheumatoid    Atrthritis  I spent more than 45 minutes face-to-face with the patient her daughter and granddaughter reviewing the scans and discussing the found the possibility of centric primary carcinoma the lung versus metastatic. The patient has multiple pulmonary nodules primarily on the left suspicious for malignancy that were not present on scans in 2010. I have reviewed with them the option of observation, or needle biopsy currently. With her age significant calcification of her coronary arteries obtaining a diagnosis without needing general anesthesia would be preferred . The patient would like to proceed with a CT-guided needle biopsy of the left lung nodules  to obtain a tissue diagnosis . We will arrange this I will see her back soon after the biopsy is completed    since she is a nonsmoker we will also obtain genetic markers on the tissue , and cultures if appropriate  Delight Ovens MD  Beeper [phone] Office [phone] 03/27/2012 2:10 PM

## 2012-03-27 NOTE — Patient Instructions (Signed)
Pulmonary Nodule A pulmonary (lung) nodule is small, round growth in the lung. The size of a pulmonary nodule can be as small as a pencil eraser (1/5 inch or 4 millmeters) to a little bigger than your biggest toenail (1 inch or 25 millimeters). A pulmonary nodule is usually an unplanned finding. It may be found on a chest X-ray or a computed tomography (CT) scan when you have imaging tests of your lungs done. When a pulmonary nodule is found, tests will be done to determine if the nodule is benign (not cancerous) or malignant (cancerous). Follow-up treatment or testing is based on the size of the pulmonary nodule and your risk of getting lung cancer.  CAUSES Causes of pulmonary nodules can vary.  Benign pulmonary nodules  can be caused from different things. Some of these things include:  Infection. This can be a common cause of a benign pulmonary nodule. The infection may be active (a current infection) or an old infection that is no longer active. Three types of infections can cause a pulmonary nodule. These are:  Bacterial Infection.  Fungal infection.  Viral Infections.  Hematoma. This is a bruise in the lung. A hematoma can happen from an injury to your chest.  Some common diseases can lead to benign pulmonary nodules. For example, rheumatoid arthritis can be a cause of a pulmonary nodule.  Other unusual things can cause a benign pulmonary nodule. These can include:  Having had tuberculosis.  Rare diseases, such as a lung cyst. Malignant pulmonary nodules.  These are cancerous growths. The cancer may have:  Started in the lung. Some lung cancers first detected as a pulmonary nodule.  Spread to the lung from cancer somewhere else in the body. This is called metastatic cancer.  Certain risk factors make a cancerous pulmonary nodule more likely. They include:  Age. As people get older, a pulmonary nodule is more likely to be cancerous.  Cancer history. If one of your immediate  family members has had cancer, you have a higher risk of developing cancer.  Smoking. This includes people who currently smoke and those who have quit. DIAGNOSIS To diagnose whether a pulmonary nodule is benign or malignant, a variety of tests will be done. This includes things such as:  Health history. Questions regarding your current health, past health, and family health will be asked.  Blood tests. Results of blood work can show:  Tumor markers for cancer.  Any type of infection.  A skin test called a tuberculin (TB) test may be done. This test can tell if you have been exposed to the germ that causes tuberculosis.  Imaging tests. These take pictures of your lungs. Types of imaging tests include:  Chest X-ray. This can help in several ways. An X-ray gives a close-up look at the pulmonary nodule. A new X-ray can be compared with any X-rays you have had in the past.   Computed tomography  (CT) scan. This test shows smaller pulmonary nodules more clearly than an X-ray.  Positron emission tomography  (PET) scan. This is a test that uses a radioactive substance to identify a pulmonary nodule. A safe amount of radioactive substance is injected into the blood stream. Then, the scan takes a picture of the pulmonary nodule. A malignant pulmonary nodule will absorb the substance faster than a benign pulmonary nodule. The radioactive substance is eliminated from your body in your urine.  Biopsy.  This removes a tiny piece of the pulmonary nodule so it can be checked  under a microscope. Medicine will be given to help keep you relaxed and pain free when a biopsy is done. Types of biopsies include:  Bronchoscopy . This is a surgical procedure. It can be used for pulmonary nodules that are close to the airways in the lung. It uses a scope (a thin tube) with a tiny camera and light on the end. The scope is put in the windpipe. Your caregiver can then see inside the lung. A tiny tool put through the  scope is used to take a small sample of the pulmonary nodule tissue.  Transthoracic needle aspiration . This method is used if the pulmonary nodule is far away from the air passages in the lung. A long, thin needle is put through the chest into the lung nodule. A CT scan is done at the same time which can make it easier to locate the pulmonary nodule.  Surgical lung biopsy . This is a surgical procedure in which the pulmonary nodule is removed. This is usually recommended when the pulmonary nodule is most likely malignant or a biopsy cannot be obtained by either bronchoscopy or transthoracic needle aspiration. PULMONARY NODULE FOLLOW-UP RECOMMENDATIONS The frequency of pulmonary nodule follow-up is based on your risk factors and size of the pulmonary nodule. If your caregiver suspects the pulmonary nodule is cancerous or the pulmonary nodule changes during any of the follow-up CT scans, additional testing or biopsies will be done.   If you have no or low risk of getting lung cancer (non-smoker, no personal cancer history), recommended follow-up is based on the following pulmonary nodule size:  A pulmonary nodule that is < 4 mm does not require any follow-up.  A pulmonary nodule that is 4 to 6 mm should be re-imaged by CT scan in 12 months.  A pulmonary nodule that is 6 to 8 mm should be re-imaged by CT scan at 6 to 12 months and then again at 18 to 24 months if no change in size.  A pulmonary nodule > 8 mm in size should be followed closely and re-imaged by CT scan at 3, 9, and 24 months.   If you are at risk of getting lung cancer (current or former smoker, family history of cancer), recommended follow-up is based on the following pulmonary nodule size:  A pulmonary nodule that is < 4 mm in size should be re-imaged by CT scan in 12 months.  A pulmonary nodule that is 4 to 6 mm in size should be re-imaged by CT scan at 6 to 12 months and again at 18 to 24 months.  A pulmonary nodule that is  6 to 8 mm in size should be re-imaged by CT scan at 3, 9, and 24 months.  A pulmonary nodule > 8 mm in size should be followed closely and re-imaged by CT scan at 3, 9, and 24 months. SEEK MEDICAL CARE IF: While waiting for test results to determine what type of pulmonary nodule you have, be sure to contact your caregiver if you:  Have trouble breathing when you are active.  Feel sick or unusually tired.  Do not feel like eating.  Lose weight without trying to.  Develop chills or night sweats.  Mild or moderate fevers generally have no long-term effects and often do not require treatment. There are a few exceptions (see below). SEEK IMMEDIATE MEDICAL CARE IF:  You cannot catch your breath or you begin wheezing.  You cannot stop coughing.  You cough up blood.  You feel like you are going to pass out or become dizzy.  You have sudden chest pain.  You have a fever or persistent symptoms for more than 72 hours.  You have a fever and your symptoms suddenly get worse. MAKE SURE YOU   Understand these instructions.  Will watch your condition.  Will get help right away if you are not doing well or get worse. Document Released: 12/20/2008 Document Revised: 05/17/2011 Document Reviewed: 12/20/2008 [facility] Patient Information 2013 [place], [place].  Needle Biopsy of Lung Care After A needle biopsy is a procedure to get a sample of cells from your body for testing. A needle biopsy may be used to take tissue or fluid samples from muscles, bones and organs, such as the liver or lungs. The sample from your needle biopsy may help your doctor determine what is causing:  A mass or lump. A needle biopsy may reveal whether a mass or lump is a cyst, an infection, a benign tumor or cancer.  Infection. Tests from a needle biopsy can help doctors determine what germs are causing an infection so that the best medicines can be used for treatment.  Inflammation. Looking closely at a needle  biopsy sample may reveal what is causing inflammation and what types of cells are involved. Your caregiver has now completed a needle biopsy of the lung to help diagnose a medical condition or to rule out a disease or condition. A needle biopsy may also be used to assess the progress of a treatment. AFTER THE PROCEDURE Once your caregiver has collected enough cells or tissue for analysis, your needle biopsy procedure is complete. Your biopsy sample is sent to a laboratory to be tested. The results may be available in a day or two. More technical tests may require more time. Ask your caregiver how long you can expect to wait.  Your health care team may apply a bandage over the areas where the needle was inserted. You may be asked to apply pressure to the bandage for several minutes to ensure there is minimal bleeding. In most cases, you can leave when your needle biopsy procedure is completed. Do not drive yourself home. Someone else should take you home. Whether you can leave right away or whether you will need to stay for observation depends on how you feel and the exam by your caregiver after the biopsy. In some cases your health care team may want to observe you for a few hours to ensure you do not have complications from your biopsy. If you received an IV sedative or general anesthetic, you will be taken to a comfortable place to relax while the medication wears off. Expect to take it easy for the rest of the day. Protect the area where you received the needle biopsy by keeping the bandage in place for as long as instructed. You may feel some mild pain or discomfort in the area, but this should stop in a day or two. Only take over-the-counter or prescription medicines for pain, discomfort, or fever as directed by your caregiver. SEEK MEDICAL CARE IF:   You have pain at the biopsy site that worsens or is not helped by medicines.  You have swelling at the needle biopsy site.  You have drainage from  the biopsy site.  You have new or unusual pain in your back or at the top of one or both shoulders. SEEK IMMEDIATE MEDICAL CARE IF:   You have a fever.  You develop lightheadedness or fainting.  You have chest pain or shortness of breath.  You have bleeding that does not stop with pressure or a bandage.  You have weakness or numbness in your legs. Document Released: 12/20/2006 Document Revised: 05/17/2011 Document Reviewed: 12/20/2006 [facility] Patient Information 2013 [place], [place].

## 2012-03-28 ENCOUNTER — Other Ambulatory Visit: Payer: Self-pay

## 2012-03-28 DIAGNOSIS — D381 Neoplasm of uncertain behavior of trachea, bronchus and lung: Secondary | ICD-10-CM

## 2012-03-31 ENCOUNTER — Other Ambulatory Visit: Payer: Self-pay | Admitting: Radiology

## 2012-03-31 ENCOUNTER — Other Ambulatory Visit (HOSPITAL_COMMUNITY): Payer: Self-pay | Admitting: Physician Assistant

## 2012-04-03 ENCOUNTER — Ambulatory Visit (HOSPITAL_COMMUNITY)
Admission: RE | Admit: 2012-04-03 | Discharge: 2012-04-03 | Disposition: A | Discharge status: Home / Self Care | Payer: Medicare Other | Source: Ambulatory Visit | Attending: Interventional Radiology | Admitting: Interventional Radiology

## 2012-04-03 ENCOUNTER — Ambulatory Visit (HOSPITAL_COMMUNITY)
Admission: RE | Admit: 2012-04-03 | Discharge: 2012-04-03 | Disposition: A | Discharge status: Home / Self Care | Payer: Medicare Other | Source: Ambulatory Visit | Attending: Cardiothoracic Surgery | Admitting: Cardiothoracic Surgery

## 2012-04-03 ENCOUNTER — Encounter (HOSPITAL_COMMUNITY): Payer: Self-pay

## 2012-04-03 DIAGNOSIS — J90 Pleural effusion, not elsewhere classified: Secondary | ICD-10-CM | POA: Diagnosis not present

## 2012-04-03 DIAGNOSIS — Y849 Medical procedure, unspecified as the cause of abnormal reaction of the patient, or of later complication, without mention of misadventure at the time of the procedure: Secondary | ICD-10-CM | POA: Insufficient documentation

## 2012-04-03 DIAGNOSIS — D381 Neoplasm of uncertain behavior of trachea, bronchus and lung: Secondary | ICD-10-CM

## 2012-04-03 DIAGNOSIS — I700 Atherosclerosis of aorta: Secondary | ICD-10-CM | POA: Insufficient documentation

## 2012-04-03 DIAGNOSIS — B459 Cryptococcosis, unspecified: Secondary | ICD-10-CM | POA: Diagnosis not present

## 2012-04-03 DIAGNOSIS — E785 Hyperlipidemia, unspecified: Secondary | ICD-10-CM | POA: Insufficient documentation

## 2012-04-03 DIAGNOSIS — R911 Solitary pulmonary nodule: Secondary | ICD-10-CM | POA: Diagnosis not present

## 2012-04-03 DIAGNOSIS — R222 Localized swelling, mass and lump, trunk: Secondary | ICD-10-CM | POA: Insufficient documentation

## 2012-04-03 DIAGNOSIS — I10 Essential (primary) hypertension: Secondary | ICD-10-CM | POA: Insufficient documentation

## 2012-04-03 DIAGNOSIS — I517 Cardiomegaly: Secondary | ICD-10-CM | POA: Diagnosis not present

## 2012-04-03 DIAGNOSIS — M810 Age-related osteoporosis without current pathological fracture: Secondary | ICD-10-CM | POA: Diagnosis not present

## 2012-04-03 DIAGNOSIS — IMO0002 Reserved for concepts with insufficient information to code with codable children: Secondary | ICD-10-CM | POA: Diagnosis not present

## 2012-04-03 DIAGNOSIS — J9383 Other pneumothorax: Secondary | ICD-10-CM | POA: Diagnosis not present

## 2012-04-03 DIAGNOSIS — J189 Pneumonia, unspecified organism: Secondary | ICD-10-CM | POA: Insufficient documentation

## 2012-04-03 LAB — CBC
Hemoglobin: 13.2 g/dL (ref 12.0–15.0)
MCH: 31.4 pg (ref 26.0–34.0)
MCV: 96.4 fL (ref 78.0–100.0)
RBC: 4.21 MIL/uL (ref 3.87–5.11)
WBC: 6.6 10*3/uL (ref 4.0–10.5)

## 2012-04-03 LAB — PROTIME-INR: Prothrombin Time: 12.6 seconds (ref 11.6–15.2)

## 2012-04-03 MED ORDER — FENTANYL CITRATE 0.05 MG/ML IJ SOLN
INTRAMUSCULAR | Status: AC
  Filled 2012-04-03: qty 2

## 2012-04-03 MED ORDER — MIDAZOLAM HCL 2 MG/2ML IJ SOLN
INTRAMUSCULAR | Status: AC | PRN
  Administered 2012-04-03: 0.5 mg via INTRAVENOUS

## 2012-04-03 MED ORDER — MIDAZOLAM HCL 2 MG/2ML IJ SOLN
INTRAMUSCULAR | Status: AC
  Filled 2012-04-03: qty 2

## 2012-04-03 MED ORDER — SODIUM CHLORIDE 0.9 % IV SOLN
Freq: Once | INTRAVENOUS | Status: AC
  Administered 2012-04-03: 10:00:00 via INTRAVENOUS

## 2012-04-03 MED ORDER — FENTANYL CITRATE 0.05 MG/ML IJ SOLN
INTRAMUSCULAR | Status: AC | PRN
  Administered 2012-04-03: 12.5 ug via INTRAVENOUS

## 2012-04-03 NOTE — H&P (Signed)
[name] Parsons is an 79 y.o. female.   Chief Complaint: after fall at home Ct was ordered in work up Coincidental finding of pulm nodules Scheduled now for left lung mass biopsy HPI: Pulm nodules; never smoker; Rh arthritis; HTN; HLD; CAD; GERD; CHF  Past Medical History  Diagnosis Date  . Multiple pulmonary nodules 03/27/2012  . Hiatal hernia   . Rheumatoid arthritis   . Hypertension   . Hyperlipidemia   . Osteoporosis   . CAD (coronary artery disease)   . Spondylosis, lumbosacral   . Hypopotassemia   . GERD (gastroesophageal reflux disease)   . Depression   . Hypotension   . Hyponatremia   . Congestive heart failure   . Osteoarthrosis, hand   . Anemia, iron deficiency   . Diastolic heart failure   . Hypopotassemia   . Hx of colonic polyps     Past Surgical History  Procedure Date  . Appendectomy   . Total abdominal hysterectomy   . Cholecystectomy   . Spinal stenosis surgery 2004  . Laminectomy     lumber with fusion  . Cataract extraction     insert prosthetic lens  . Hyperplastic polyp removed 201    Dr Jennye Boroughs    Family History  Problem Relation Age of Onset  . Diabetes Mother   . Diabetes Sister   . Diabetes Maternal Grandfather    Social History:  reports that she has never smoked. She has never used smokeless tobacco. She reports that she does not drink alcohol or use illicit drugs.  Allergies:  Allergies  Allergen Reactions  . Iohexol      Code: HIVES, Desc: PT DEVELOPED HIVES AFTER GETTING CONTRAST MEDIA;THIS OCCURED "SEVERAL" YRS. AGO.CT NOTIFIED 03/05/09!, Onset Date: [id number]      (Not in a hospital admission)  Results for orders placed during the hospital encounter of 04/03/12 (from the past 48 hour(s))  CBC     Status: Abnormal   Collection Time   04/03/12  9:51 AM      Component Value Range Comment   WBC 6.6  4.0 - 10.5 K/uL    RBC 4.21  3.87 - 5.11 MIL/uL    Hemoglobin 13.2  12.0 - 15.0 g/dL    HCT 86.5  78.4 - 69.6 %    MCV  96.4  78.0 - 100.0 fL    MCH 31.4  26.0 - 34.0 pg    MCHC 32.5  30.0 - 36.0 g/dL    RDW 29.5 (*) 28.4 - 15.5 %    Platelets 207  150 - 400 K/uL    No results found.  Review of Systems  Constitutional: Negative for fever.  Respiratory: Negative for cough and shortness of breath.   Cardiovascular: Negative for chest pain.  Gastrointestinal: Negative for nausea, vomiting and abdominal pain.  Neurological: Positive for weakness. Negative for headaches.    Blood pressure 157/60, pulse 71, temperature 98.1 F (36.7 C), temperature source Oral, resp. rate 20, height 5' (1.524 m), weight 111 lb (50.349 kg), SpO2 96.00%. Physical Exam  Constitutional: She is oriented to person, place, and time. She appears well-developed.  Cardiovascular: Normal rate, regular rhythm and normal heart sounds.   No murmur heard. Respiratory: Effort normal and breath sounds normal. She has no wheezes.  GI: Soft. Bowel sounds are normal. There is no tenderness.  Musculoskeletal: Normal range of motion.       Uses walker  Neurological: She is alert and oriented to person, place, and time.  Skin: Skin is warm and dry.  Psychiatric: She has a normal mood and affect. Her behavior is normal. Judgment and thought content normal.     Assessment/Plan Left lung mass Coincidental finding on CT after fall Scheduled for bx today Pt aware of procedure benefits and risks and agreeable to proceed Consent signed and in chart  Marvie Brevik A 04/03/2012, 10:20 AM

## 2012-04-03 NOTE — Procedures (Signed)
Successful CT FLUORO GUIDED LUL NODULE 18 G CORE BX NO COMP STABLE PATH PENDING

## 2012-04-06 ENCOUNTER — Encounter: Payer: Self-pay | Admitting: Infectious Disease

## 2012-04-06 ENCOUNTER — Ambulatory Visit (INDEPENDENT_AMBULATORY_CARE_PROVIDER_SITE_OTHER): Payer: Medicare Other | Admitting: Cardiothoracic Surgery

## 2012-04-06 ENCOUNTER — Encounter: Payer: Self-pay | Admitting: Cardiothoracic Surgery

## 2012-04-06 ENCOUNTER — Ambulatory Visit (INDEPENDENT_AMBULATORY_CARE_PROVIDER_SITE_OTHER): Payer: Medicare Other | Admitting: Infectious Disease

## 2012-04-06 VITALS — BP 126/59 | HR 58 | Temp 97.7°F | Ht 60.0 in | Wt 111.5 lb

## 2012-04-06 VITALS — BP 124/57 | HR 62 | Temp 98.0°F | Resp 20 | Ht 60.0 in | Wt 111.0 lb

## 2012-04-06 DIAGNOSIS — E785 Hyperlipidemia, unspecified: Secondary | ICD-10-CM

## 2012-04-06 DIAGNOSIS — Z9889 Other specified postprocedural states: Secondary | ICD-10-CM | POA: Diagnosis not present

## 2012-04-06 DIAGNOSIS — S41109A Unspecified open wound of unspecified upper arm, initial encounter: Secondary | ICD-10-CM | POA: Diagnosis not present

## 2012-04-06 DIAGNOSIS — B459 Cryptococcosis, unspecified: Secondary | ICD-10-CM | POA: Diagnosis not present

## 2012-04-06 DIAGNOSIS — M069 Rheumatoid arthritis, unspecified: Secondary | ICD-10-CM | POA: Diagnosis not present

## 2012-04-06 DIAGNOSIS — R918 Other nonspecific abnormal finding of lung field: Secondary | ICD-10-CM

## 2012-04-06 LAB — CBC WITH DIFFERENTIAL/PLATELET
Basophils Relative: 1 % (ref 0–1)
Eosinophils Absolute: 0.1 10*3/uL (ref 0.0–0.7)
Eosinophils Relative: 1 % (ref 0–5)
HCT: 37.9 % (ref 36.0–46.0)
Hemoglobin: 12.7 g/dL (ref 12.0–15.0)
Lymphs Abs: 1.8 10*3/uL (ref 0.7–4.0)
MCH: 31.7 pg (ref 26.0–34.0)
MCHC: 33.5 g/dL (ref 30.0–36.0)
MCV: 94.5 fL (ref 78.0–100.0)
Monocytes Absolute: 0.7 10*3/uL (ref 0.1–1.0)
Monocytes Relative: 8 % (ref 3–12)
Neutrophils Relative %: 70 % (ref 43–77)
RBC: 4.01 MIL/uL (ref 3.87–5.11)

## 2012-04-06 MED ORDER — FLUCONAZOLE 200 MG PO TABS: 400.0000 mg | ORAL_TABLET | Freq: Every day | ORAL | Status: DC

## 2012-04-06 NOTE — Progress Notes (Signed)
Subjective:    Patient ID: [name] Parsons, female    DOB: 05/24/1919, 79 y.o.   MRN: [id number]  HPI  79 year old female with history of rheumatoid arthritis on methotrexate and prednisone who had a prior pulmonary nodule. She fell at home in late December and scraped her right arm.  While in the emergency room in [place] a chest x-ray was obtained that showed pulmonary nodules a subsequent CT scan of the chest and PET scan were obtained. The patient is referred for asymptomatic multiple pulmonary nodules. She underwent IR guided  Biopsy and pathology showd  Lung, needle/core biopsy(ies), Left upper lobe - DENSELY INFLAMED LUNG PARENCHYMA WITH MULTINUCLEATED GIANT CELL REACTION AND ORGANISMS MOST CONSISTENT WITH CRYPTOCOCCUS.  Microscopic Comment Mucicarmine, alcian blue and GMS stains highlight the presence of microorganisms.   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 She is better for her to [place] from cardiothoracic surgery for further recommendations with regards to therapy. Recommend starting on fluconazole a dose of 40 mg daily. Mother is a drug interaction with her atorvastatin and recommend that she stop this for now ask her primary care physician for lower dose of atorvastatin. Also check serum cryptococcal antigen today as well as her compress metabolic panel his CBC with differential.  Review of Systems  Constitutional: Negative for fever, chills, diaphoresis, activity change, appetite change, fatigue and unexpected weight change.  HENT: Negative for congestion, sore throat, rhinorrhea, sneezing, trouble swallowing and sinus pressure.   Eyes: Negative for photophobia and visual disturbance.  Respiratory: Negative for cough, chest tightness, shortness of breath, wheezing and stridor.   Cardiovascular: Negative for chest pain, palpitations and leg swelling.  Gastrointestinal: Negative for nausea,  vomiting, abdominal pain, diarrhea, constipation, blood in stool, abdominal distention and anal bleeding.  Genitourinary: Negative for dysuria, hematuria, flank pain and difficulty urinating.  Musculoskeletal: Negative for myalgias, back pain, joint swelling, arthralgias and gait problem.  Skin: Positive for wound. Negative for color change, pallor and rash.  Neurological: Negative for dizziness, tremors, weakness and light-headedness.  Hematological: Negative for adenopathy. Does not bruise/bleed easily.  Psychiatric/Behavioral: Negative for behavioral problems, confusion, sleep disturbance, dysphoric mood, decreased concentration and agitation.       Objective:   Physical Exam  Constitutional: She is oriented to person, place, and time. She appears well-nourished. No distress.  HENT:  Head: Normocephalic and atraumatic.  Mouth/Throat: Oropharyngeal exudate present.  Eyes: Conjunctivae normal and EOM are normal. No scleral icterus.  Neck: Normal range of motion. Neck supple.  Cardiovascular: Normal rate, regular rhythm and normal heart sounds.  Exam reveals no gallop and no friction rub.   No murmur heard. Pulmonary/Chest: Effort normal and breath sounds normal. No respiratory distress. She has no wheezes. She has no rales. She exhibits no tenderness.  Abdominal: She exhibits no distension and no mass. There is no tenderness. There is no rebound and no guarding.  Musculoskeletal: She exhibits no edema and no tenderness.  Neurological: She is alert and oriented to person, place, and time. She exhibits normal muscle tone. Coordination normal.  Skin: Skin is warm and dry. She is not diaphoretic. No erythema. No pallor.     Psychiatric: She has a normal mood and affect. Her behavior is normal. Judgment and thought content normal.          Assessment & Plan:  Cryptococcosis: Check serum cryptococcal antigen start fluconazole 400 mg daily continue this for a year with  followup  imaging.  Hyper lipidemia: Ask her primary care physician to drop her dose of Lipitor down to 20 mg given the drug drug interaction between fluconazole and atorvastatin.  Wound from fall: This is to be healing well.  RA: followed by Azzie Roup, MD. Likely the prednisone helped facilitate cryptococcal infection. I would NOT hazard immunologics in this pt given risk of fulminant disease from cryptococcus

## 2012-04-06 NOTE — Progress Notes (Signed)
[address]            [place] [address]          [phone]      [name] Parsons [facility] Medical Record #[id number] Date of Birth: [birth year]  Referring: Paulina Fusi, MD Primary Care: Paulina Fusi, MD  Chief Complaint:    Chief Complaint  Patient presents with  . Routine Post Op    S/P needle BX on 04/03/12, discuss results    History of Present Illness:    Patient is a 79 year old female with no previous history of malignancy or history of smoking in the past. She fell at home in late December and scraped her right arm. While in the emergency room in [place] a chest x-ray was obtained that showed pulmonary nodules a subsequent CT scan of the chest and PET scan were obtained. The patient is referred for asymptomatic multiple pulmonary nodules. She does have a long history of rheumatoid arthritis and has been on prednisone started taking September 2013 she is followed by Dr. Azzie Roup for her rheumatoid arthritis on methotrexate and prednisone. She denies any fever or chills or cough. She's had no hemoptysis.   Patient returns today after needle bx of lung mass done    Current Activity/ Functional Status: Patient is independent with mobility/ambulation, transfers, ADL's, IADL's.   Past Medical History  Diagnosis Date  . Multiple pulmonary nodules 03/27/2012  . Hiatal hernia   . Rheumatoid arthritis   . Hypertension   . Hyperlipidemia   . Osteoporosis   . CAD (coronary artery disease)   . Spondylosis, lumbosacral   . Hypopotassemia   . GERD (gastroesophageal reflux disease)   . Depression   . Hypotension   . Hyponatremia   . Congestive heart failure   . Osteoarthrosis, hand   . Anemia, iron deficiency   . Diastolic heart failure   . Hypopotassemia   . Hx of colonic polyps     Past Surgical History  Procedure Date  . Appendectomy   . Total abdominal hysterectomy   . Cholecystectomy   . Spinal stenosis  surgery 2004  . Laminectomy     lumber with fusion  . Cataract extraction     insert prosthetic lens  . Hyperplastic polyp removed 201    Dr Jennye Boroughs    Family History  Problem Relation Age of Onset  . Diabetes Mother   . Diabetes Sister   . Diabetes Maternal Grandfather     History   Social History  . Marital Status: Widowed    Spouse Name: N/A    Number of Children: N/A  . Years of Education: N/A   Occupational History  . retired    Social History Main Topics  . Smoking status: Never Smoker   . Smokeless tobacco: Never Used  . Alcohol Use: No  . Drug Use: No  . Sexually Active: Not on file   Other Topics Concern  . Not on file   Social History Narrative  . No narrative on file    History  Smoking status  . Never Smoker   Smokeless tobacco  . Never Used    History  Alcohol Use No     Allergies  Allergen Reactions  . Iohexol      Code: HIVES, Desc: PT DEVELOPED HIVES AFTER GETTING CONTRAST MEDIA;THIS OCCURED "SEVERAL" YRS. AGO.CT NOTIFIED 03/05/09!, Onset  Date: [id number]     Current Outpatient Prescriptions  Medication Sig Dispense Refill  . alendronate (FOSAMAX) 70 MG tablet Take 70 mg by mouth every 7 (seven) days.       Marland Kitchen atorvastatin (LIPITOR) 40 MG tablet Take 40 mg by mouth daily.      . bethanechol (URECHOLINE) 25 MG tablet Take 25 mg by mouth 3 (three) times daily.      . Calcium-Vitamin D 600-125 MG-UNIT TABS Take by mouth daily.      . cyclobenzaprine (FLEXERIL) 10 MG tablet Take 10 mg by mouth at bedtime.       . ferrous gluconate (FERGON) 325 MG tablet Take 325 mg by mouth 2 (two) times daily.      . folic acid (FOLVITE) 1 MG tablet Take 1 mg by mouth daily.      . furosemide (LASIX) 40 MG tablet Take 40 mg by mouth 2 (two) times daily.      Marland Kitchen HYDROcodone-acetaminophen (NORCO) 10-325 MG per tablet Take 1 tablet by mouth every 6 (six) hours as needed.      . loperamide (IMODIUM) 2 MG capsule Take 2 mg by mouth 4 (four) times daily as  needed.      Marland Kitchen Lysine HCl 500 MG TABS Take by mouth daily.      . magnesium hydroxide (MILK OF MAGNESIA) 400 MG/5ML suspension Take 15 mLs by mouth daily as needed. For constipation      . methotrexate (RHEUMATREX) 2.5 MG tablet Take 10 mg by mouth once a week.       . metoprolol (LOPRESSOR) 50 MG tablet Take 25 mg by mouth 2 (two) times daily.      . Multiple Vitamin (MULTIVITAMIN) capsule Take 1 capsule by mouth daily.      . Omega-3 Fatty Acids (FISH OIL) 1000 MG CAPS Take by mouth daily.      Marland Kitchen omeprazole (PRILOSEC) 20 MG capsule Take 20 mg by mouth daily.      . potassium chloride SA (K-DUR,KLOR-CON) 20 MEQ tablet Take 20 mEq by mouth daily.       . predniSONE (DELTASONE) 5 MG tablet Take 5 mg by mouth daily.       . psyllium (METAMUCIL) 0.52 G capsule Take 0.52 g by mouth daily.      . ranitidine (ZANTAC) 150 MG tablet Take 150 mg by mouth 2 (two) times daily.      . Tamsulosin HCl (FLOMAX) 0.4 MG CAPS Take 0.4 mg by mouth daily.      . vitamin E 400 UNIT capsule Take 400 Units by mouth daily.           Review of Systems:     Cardiac Review of Systems: Y or N  Chest Pain [  n  ]  Resting SOB [ n  ] Exertional SOB  [ y ]  Pollyann Kennedy Milo.Brash  ]   Pedal Edema [ mild  ]    Palpitations [ n ] Syncope  [  n]   Presyncope [n   ]  General Review of Systems: [Y] = yes [  ]=no Constitional: recent weight change [  ]; anorexia [  ]; fatigue Cove.Etienne  ]; nausea [n  ]; night sweats [n  ]; fever [ n ]; or chills [ n ];  Dental: poor dentition[ n ]; Last Dentist visit:   Eye : blurred vision [  ]; diplopia [   ]; vision changes [  ];  Amaurosis fugax[  ]; Resp: cough [ n ];  wheezing[n  ];  hemoptysis[n  ]; shortness of breath[ y ]; paroxysmal nocturnal dyspnea[  ]; dyspnea on exertion[y  ]; or orthopnea[  ];  GI:  gallstones[  ], vomiting[  ];  dysphagia[  ]; melena[  ];   hematochezia [  ]; heartburn[  ];   Hx of  Colonoscopy[ y ]; GU: kidney stones [  ]; hematuria[  ];   dysuria [  ];  nocturia[  ];  history of     obstruction [  ];             Skin: rash, swelling[  ];, hair loss[  ];  peripheral edema[  ];  or itching[  ]; Musculosketetal: myalgias[  y];  joint swelling[ y ];  joint erythema[ y ];  joint pain[y ];  back pain[ y ];  Heme/Lymph: bruising[  ];  bleeding[  ];  anemia[  ];  Neuro: TIA[  ];  headaches[  ];  stroke[  ];  vertigo[  ];  seizures[  ];   paresthesias[  ];  difficulty walking[  ];  Psych:depression[  ]; anxiety[  ];  Endocrine: diabetes[  ];  thyroid dysfunction[  ];  Immunizations: Flu [  ]; Pneumococcal[  ];  Other:  Physical Exam: BP 124/57  Pulse 62  Temp 98 F (36.7 C) (Oral)  Resp 20  Ht 5' (1.524 m)  Wt 111 lb (50.349 kg)  BMI 21.68 kg/m2  SpO2 95%  General appearance: alert, cooperative, appears stated age and no distress Neurologic: intact Heart: regular rate and rhythm, S1, S2 normal, no murmur, click, rub or gallop and normal apical impulse Lungs: clear to auscultation bilaterally and normal percussion bilaterally Abdomen: soft, non-tender; bowel sounds normal; no masses,  no organomegaly Extremities: extremities normal, atraumatic, no cyanosis or edema and Homans sign is negative, no sign of DVT I do not appreciate any cervical or supra-clavicular adenopathy   Diagnostic Studies & Laboratory data:     Recent Radiology Findings:  02/2012:  RADIOLOGY REPORT*  Clinical Data: Evaluate pulmonary nodules suspected on recent chest x-ray.  CT CHEST WITHOUT CONTRAST  Technique: Multidetector CT imaging of the chest was performed following the standard protocol without IV contrast.  Comparison: Chest x-ray 03/07/2012.  Findings: The chest wall is unremarkable. No obvious breast masses, supraclavicular or axillary lymphadenopathy. Thyroid gland is mildly enlarged and somewhat nodular likely reflecting  thyroid goiter. No discrete nodule or mass. The bony thorax is intact. No destructive bone lesions or spinal canal compromise. Vertebral augmentation changes are noted in the L1 vertebral body.  The heart is normal in size for age. No pericardial effusion. No mediastinal or hilar lymphadenopathy. Dense atherosclerotic calcifications involving the aorta and branch vessels. No focal aneurysm. There is a large hiatal hernia.  Examination of the lung parenchyma confirms multiple pulmonary nodules much greater in the left lung. Ill-defined and somewhat irregular margins. Findings could be due to septic emboli or metastatic pulmonary disease. No focal infiltrates, edema or effusions. The tracheobronchial tree is grossly normal.  The upper abdomen is unremarkable.  IMPRESSION:  1. Multiple bilateral pulmonary nodules suspicious for metastatic disease. Septic emboli also possible but much less likely. 2. No mediastinal or hilar lymphadenopathy. 3. Large hiatal hernia.  PET 03/2012: NUCLEAR MEDICINE PET  SKULL BASE TO THIGH  Fasting Blood Glucose: 90  Technique: 13 mCi F-18 FDG was injected intravenously. CT data was obtained and used for attenuation correction and anatomic localization only. (This was not acquired as a diagnostic CT examination.) Additional exam technical data entered on technologist worksheet.  Comparison: 03/07/2012  Findings:  Neck: No hypermetabolic lymph nodes within the neck. Diffuse increased uptake is identified throughout both lobes of the thyroid gland.  Chest: Left upper lobe nodule measures 1.1 cm and has an SUV max equal to 2.8, image 82. The subpleural left upper lobe nodule measures 1.6 cm and has an SUV max equal to 4.6, image 71. Hypermetabolic nodule in the right apex measures 8 mm and has an SUV max equal to 2.19, image 66. Right upper lobe nodule measures 4 mm. Too small to characterize. The superior segment of the left lower lobe nodule  measures 3 mm, image 75. This is also too small to characterize. No hypermetabolic mediastinal or hilar lymph nodes. There is a large hiatal hernia. The heart size is enlarged. No pericardial effusion. Prominent coronary artery calcifications involve the left main, LAD, left circumflex and RCA coronary arteries.  Abdomen/Pelvis: No abnormal hypermetabolic activity within the liver, pancreas, adrenal glands, or spleen. No hypermetabolic lymph nodes in the abdomen or pelvis.  Skeleton: No focal hypermetabolic activity to suggest skeletal metastasis.  IMPRESSION:  1. There are three nodules within the left upper lobe which exhibit malignant range FDG uptake and are worrisome for multi centric bronchogenic carcinoma. 2. Prominent coronary artery calcifications. 3. Diffuse uptake throughout the thyroid gland suspicious for a thyroiditis. 4. Large hiatal hernia.   Original Report Authenticated By: Signa Kell, M.D.       02/2009 Clinical Data: Suspicion for left upper lobe nodule on CXR  CT CHEST WITH CONTRAST  Technique: Multidetector CT imaging of the chest was performed  following the standard protocol during bolus administration of  intravenous contrast.  Contrast: 100 ml Omnipaque-300 IV. The patient received 13 hour  prep for hive allergy.  Comparison: CT 01/22/2009  Findings: The previously noted 7 x 16 mm nodular focus in the  posterior aspect left upper lobe has resolved. There is a  calcified granuloma in the posterior segment of the left upper  lobe. No change in two small nodules in the lateral aspect of the  right middle lobe (image 34). They measure approximately 5 mm in  diameter. Stable 3 x 4 mm subpleural nodule in the right upper  lobe (image 20). Stable small ground-glass opacity in the left  midlung field of (image 26).  Large hiatal hernia. Thyroid gland is again noted to be prominent  in size. No pathologically enlarged lymph nodes. Slight   calcification of the LAD coronary artery branch. Mild biliary  ductal dilatation. This was not present on the prior exam.  Recommend correlation with LFTs.  IMPRESSION:  Resolution of the previously noted nodular lesion in the left upper  lobe. Stable small nodules described above. Mild biliary ductal  dilatation. Recommend correlation with LFTs.   Recent Lab Findings: Lab Results  Component Value Date   WBC 6.6 04/03/2012   HGB 13.2 04/03/2012   HCT 40.6 04/03/2012   PLT 207 04/03/2012   GLUCOSE 105* 10/03/2010   NA 141 10/03/2010   K 3.6 10/03/2010   CL 100 10/03/2010   CREATININE 1.20* 10/03/2010   BUN 23 10/03/2010   CO2 27 01/21/2009   INR 0.95 04/03/2012      Assessment /  Plan:   1  Multiple bilateral pulmonary nodules   now with needle BX postive for  cryptococcus 2 Large hiatal hernia 3 Prominent coronary artery calcifications. 4  Diffuse uptake throughout the thyroid gland suspicious for a thyroiditis. 5 Rheumatoid    Atrthritis   Patient referred to ID clinic and will be seen today I will see back in one month with chest xray   Delight Ovens MD  Beeper [phone] Office [phone] 04/06/2012 3:25 PM

## 2012-04-06 NOTE — Patient Instructions (Signed)
Cryptococcosis Cryptococcosis is an uncommon illness caused by a yeast (fungus). The two species that cause most human infections are:  Cryptococcus neoformans, found in soil and causes infections around the world.  Cryptococcus gattii, originally found in [place]. It is also known as C. gattii. Since 1999, for unknown reasons, C. Gattii has caused human infections in people from [place], [place] state and [place]. CAUSES  Cryptococci causes infection when they are breathed into the lungs. Animals, including dogs and cats, can also get sick after breathing in the yeast. Usually healthy people who inhale the Crytococcus neoformans yeast do not get symptoms. However, people with medical problems involving their immune system may get symptoms. This includes:  People with organ transplants.  People taking medicine that suppresses their immune system.  Those with diseases such as diabetes , HIV and lymphoma. Current medical evidences now suggests that people in the [facility] and without immune system problems have developed disease due to C. gattii.  SYMPTOMS  Cryptococcosis is most commonly recognized as a lung or brain lining infection (meningitis). However, any organ in the body can be infected. Lung infection typically causes chest pain or cough. Lung infection is usually first recognized when a chest X-ray is done. When the brain lining is infected, common symptoms include:   Headaches.  Confusion.  Personality change.  Drowsiness. DIAGNOSIS  Diagnosis involves testing a sample of coughed up phlegm from the lungs (sputum) or fluid around the spine from a spinal tap. Blood and spinal fluid can be tested for the presence of substances produced by the yeast. Medicine is available to treat this illness. Some people with a good immune system and infection limited to the lungs may not need any drug treatment. PROGNOSIS  Infection of the linings around the brain may lead to  permanent mental, visual or physical damage. About one in ten people will die from the infection. Most of this information is courtesy of the CDC.  Document Released: 01/21/2005 Document Revised: 05/17/2011 Document Reviewed: 07/01/2008 [facility] Patient Information 2013 [place], [place].

## 2012-04-07 ENCOUNTER — Telehealth: Payer: Self-pay | Admitting: *Deleted

## 2012-04-07 LAB — CRYPTOCOCCAL ANTIGEN
Crypto Ag: POSITIVE
Cryptococcal Ag Titer: 1:5 {titer}

## 2012-04-07 LAB — COMPLETE METABOLIC PANEL WITH GFR
ALT: 17 U/L (ref 0–35)
AST: 22 U/L (ref 0–37)
CO2: 28 mEq/L (ref 19–32)
Creat: 0.83 mg/dL (ref 0.50–1.10)
GFR, Est African American: 72 mL/min
Sodium: 141 mEq/L (ref 135–145)
Total Bilirubin: 0.5 mg/dL (ref 0.3–1.2)
Total Protein: 5.6 g/dL — ABNORMAL LOW (ref 6.0–8.3)

## 2012-04-07 NOTE — Telephone Encounter (Signed)
Call from [facility] lab, patient had a positive cryptococcal antigen. Lab result faxed and placed in Dr. Zenaida Niece Dams box. [name] Parsons

## 2012-04-07 NOTE — Telephone Encounter (Signed)
I saw this. I have started her on fluconazole

## 2012-04-10 ENCOUNTER — Telehealth: Payer: Self-pay | Admitting: *Deleted

## 2012-04-10 NOTE — Telephone Encounter (Signed)
She can do tWO things :  #1 take zofran 4mg  one hour prior to her fluconazole  She can also take the fluconazole 200mg  TWICE daily rather than 400mg  once daily if this will help her.

## 2012-04-10 NOTE — Telephone Encounter (Signed)
Patient granddaughter [name] Parsons called and advised that the patient has been sick since she started on the Diflucan. She advised that the patient has had headaches, nausea and vomiting. She advised that it happens at the same time daily after the patient takes her meds. They report no fever or pain anywhere else. They were wondering if doctor could call in something for nausea or change the medication. Advised will have to consult the doctor and get back to the patient this afternoon.

## 2012-04-11 ENCOUNTER — Other Ambulatory Visit: Payer: Self-pay

## 2012-04-11 ENCOUNTER — Telehealth: Payer: Self-pay

## 2012-04-11 DIAGNOSIS — R110 Nausea: Secondary | ICD-10-CM

## 2012-04-11 MED ORDER — ONDANSETRON HCL 4 MG PO TABS: 4.0000 mg | ORAL_TABLET | Freq: Two times a day (BID) | ORAL | Status: DC

## 2012-04-11 NOTE — Telephone Encounter (Signed)
Returning call from yesterday,  I will call her  medication to pharmacy.

## 2012-04-12 DIAGNOSIS — M069 Rheumatoid arthritis, unspecified: Secondary | ICD-10-CM | POA: Diagnosis not present

## 2012-04-12 DIAGNOSIS — M810 Age-related osteoporosis without current pathological fracture: Secondary | ICD-10-CM | POA: Diagnosis not present

## 2012-04-14 DIAGNOSIS — M25519 Pain in unspecified shoulder: Secondary | ICD-10-CM | POA: Diagnosis not present

## 2012-04-14 DIAGNOSIS — M25569 Pain in unspecified knee: Secondary | ICD-10-CM | POA: Diagnosis not present

## 2012-04-14 DIAGNOSIS — I10 Essential (primary) hypertension: Secondary | ICD-10-CM | POA: Diagnosis not present

## 2012-04-24 DIAGNOSIS — M25559 Pain in unspecified hip: Secondary | ICD-10-CM | POA: Diagnosis not present

## 2012-05-03 ENCOUNTER — Encounter: Payer: Self-pay | Admitting: Infectious Disease

## 2012-05-03 ENCOUNTER — Ambulatory Visit (INDEPENDENT_AMBULATORY_CARE_PROVIDER_SITE_OTHER): Payer: Medicare Other | Admitting: Infectious Disease

## 2012-05-03 ENCOUNTER — Other Ambulatory Visit: Payer: Self-pay | Admitting: *Deleted

## 2012-05-03 VITALS — BP 125/54 | HR 79 | Temp 97.4°F | Ht 60.0 in | Wt 106.0 lb

## 2012-05-03 DIAGNOSIS — R51 Headache: Secondary | ICD-10-CM

## 2012-05-03 DIAGNOSIS — M069 Rheumatoid arthritis, unspecified: Secondary | ICD-10-CM

## 2012-05-03 DIAGNOSIS — B459 Cryptococcosis, unspecified: Secondary | ICD-10-CM | POA: Diagnosis not present

## 2012-05-03 DIAGNOSIS — R918 Other nonspecific abnormal finding of lung field: Secondary | ICD-10-CM

## 2012-05-03 DIAGNOSIS — E785 Hyperlipidemia, unspecified: Secondary | ICD-10-CM | POA: Diagnosis not present

## 2012-05-03 LAB — COMPLETE METABOLIC PANEL WITH GFR
Alkaline Phosphatase: 61 U/L (ref 39–117)
BUN: 32 mg/dL — ABNORMAL HIGH (ref 6–23)
CO2: 28 mEq/L (ref 19–32)
Creat: 1.12 mg/dL — ABNORMAL HIGH (ref 0.50–1.10)
GFR, Est African American: 50 mL/min — ABNORMAL LOW
GFR, Est Non African American: 43 mL/min — ABNORMAL LOW
Glucose, Bld: 109 mg/dL — ABNORMAL HIGH (ref 70–99)
Sodium: 134 mEq/L — ABNORMAL LOW (ref 135–145)
Total Bilirubin: 0.6 mg/dL (ref 0.3–1.2)
Total Protein: 6.1 g/dL (ref 6.0–8.3)

## 2012-05-03 LAB — CBC WITH DIFFERENTIAL/PLATELET
Basophils Absolute: 0 10*3/uL (ref 0.0–0.1)
Basophils Relative: 0 % (ref 0–1)
HCT: 38.8 % (ref 36.0–46.0)
Hemoglobin: 13.2 g/dL (ref 12.0–15.0)
Lymphocytes Relative: 12 % (ref 12–46)
MCHC: 34 g/dL (ref 30.0–36.0)
Monocytes Relative: 6 % (ref 3–12)
Neutro Abs: 8.4 10*3/uL — ABNORMAL HIGH (ref 1.7–7.7)
Neutrophils Relative %: 81 % — ABNORMAL HIGH (ref 43–77)
WBC: 10.4 10*3/uL (ref 4.0–10.5)

## 2012-05-03 MED ORDER — ATORVASTATIN CALCIUM 20 MG PO TABS: 20.0000 mg | ORAL_TABLET | Freq: Every day | ORAL | Status: AC

## 2012-05-03 NOTE — Progress Notes (Signed)
Subjective:    Patient ID: [name] Parsons, female    DOB: [birth year], 79 y.o.   MRN: [id number]  HPI  79 year old female with history of rheumatoid arthritis on methotrexate and prednisone who had a prior pulmonary nodule. She fell at home in late December 2013  and scraped her right arm.   While in the emergency room in [place] a chest x-ray was obtained that showed pulmonary nodules a subsequent CT scan of the chest and PET scan were obtained.  The patient is referred for asymptomatic multiple pulmonary nodules. She underwent IR guided Biopsy and pathology showd   Lung, needle/core biopsy(ies), Left upper lobe  - DENSELY INFLAMED LUNG PARENCHYMA WITH MULTINUCLEATED GIANT CELL REACTION AND  ORGANISMS MOST CONSISTENT WITH CRYPTOCOCCUS.  Microscopic Comment   Mucicarmine, alcian blue and GMS stains highlight the presence of microorganisms.  Not have cultures sent but this but serum cryptococcal ag was positive at titer of 1:5. We started her on fluconazole at 400mg  daily. As having been seen by [place] she felt some nausea with taking the 2 200 mg tablets for consult once and instead change to 200 mg twice daily. Her immuno-suppression was lightened slightly by Dr. Dareen Piano by reduction of her methotrexate from 10 tablets weekly to 8 tablets weekly.  And then she appears to be doing relatively well although she has had some mild headaches for last 3 days behind her eyes and on her temporal areas. She has no neck stiffness and no evidence of meningismus.  She has appointment to see Dr. Tyrone Sage tomorrow with cardiothoracic surgery. And she will have a repeat chest x-ray at that time.  Review of Systems  Constitutional: Negative for fever, chills, diaphoresis, activity change, appetite change, fatigue and unexpected weight change.  HENT: Negative for congestion, sore throat, rhinorrhea, sneezing, trouble swallowing and sinus pressure.   Eyes: Negative for photophobia and visual disturbance.   Respiratory: Negative for cough, chest tightness, shortness of breath, wheezing and stridor.   Cardiovascular: Negative for chest pain, palpitations and leg swelling.  Gastrointestinal: Negative for nausea, vomiting, abdominal pain, diarrhea, constipation, blood in stool, abdominal distention and anal bleeding.  Genitourinary: Negative for dysuria, hematuria, flank pain and difficulty urinating.  Musculoskeletal: Negative for myalgias, back pain, joint swelling, arthralgias and gait problem.  Skin: Negative for color change, pallor, rash and wound.  Neurological: Positive for headaches. Negative for dizziness, tremors, weakness and light-headedness.  Hematological: Negative for adenopathy. Does not bruise/bleed easily.  Psychiatric/Behavioral: Negative for behavioral problems, confusion, sleep disturbance, dysphoric mood, decreased concentration and agitation.       Objective:   Physical Exam  Constitutional: She is oriented to person, place, and time. No distress.  HENT:  Head: Normocephalic and atraumatic.  Mouth/Throat: Oropharynx is clear and moist. No oropharyngeal exudate.  Eyes: Conjunctivae and EOM are normal. No scleral icterus.  Neck: Normal range of motion. Neck supple.  Cardiovascular: Normal rate, regular rhythm and normal heart sounds.  Exam reveals no gallop and no friction rub.   No murmur heard. Pulmonary/Chest: Effort normal and breath sounds normal. No respiratory distress. She has no wheezes. She has no rales. She exhibits no tenderness.  Abdominal: She exhibits no distension and no mass. There is no tenderness. There is no rebound and no guarding.  Musculoskeletal: She exhibits no edema and no tenderness.  Neurological: She is alert and oriented to person, place, and time. She exhibits normal muscle tone. Coordination normal.  Skin: Skin is warm and dry. She is not diaphoretic.  No erythema. No pallor.  Psychiatric: She has a normal mood and affect. Her behavior is  normal. Judgment and thought content normal.          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 Note there is a risk of immune reconstitution inflammatory syndrome with any tapering of immunosuppressive therapies to care should be taken to observe for symptoms of IRIS. I dont currently believe her recent HA are significant enough to warrant aggressive investigation but if they worsen she would need LP with opening pressure and sufficient CSF removed to drop closing pressure to 20cm. If she indeed has cryptococcal meningitis one could consider ampho but I would avoid this given her age and go with fluconazole instead.   Headaches: see above. again use caution with lightening of immune suppression given risk of IRIS]  Ra: seeing Dr Dareen Piano  Hyperlipidemia: statin dose halved due to drug drug reaction with fluconazole.

## 2012-05-04 ENCOUNTER — Encounter: Payer: Self-pay | Admitting: Cardiothoracic Surgery

## 2012-05-04 ENCOUNTER — Ambulatory Visit
Admission: RE | Admit: 2012-05-04 | Discharge: 2012-05-04 | Disposition: A | Discharge status: Home / Self Care | Payer: Medicare Other | Source: Ambulatory Visit | Attending: Cardiothoracic Surgery | Admitting: Cardiothoracic Surgery

## 2012-05-04 ENCOUNTER — Ambulatory Visit (INDEPENDENT_AMBULATORY_CARE_PROVIDER_SITE_OTHER): Payer: Medicare Other | Admitting: Cardiothoracic Surgery

## 2012-05-04 VITALS — BP 89/52 | HR 88 | Resp 20 | Ht 60.0 in | Wt 106.0 lb

## 2012-05-04 DIAGNOSIS — R918 Other nonspecific abnormal finding of lung field: Secondary | ICD-10-CM

## 2012-05-04 DIAGNOSIS — B459 Cryptococcosis, unspecified: Secondary | ICD-10-CM | POA: Diagnosis not present

## 2012-05-04 DIAGNOSIS — K449 Diaphragmatic hernia without obstruction or gangrene: Secondary | ICD-10-CM | POA: Diagnosis not present

## 2012-05-04 DIAGNOSIS — Z9889 Other specified postprocedural states: Secondary | ICD-10-CM

## 2012-05-04 DIAGNOSIS — J984 Other disorders of lung: Secondary | ICD-10-CM | POA: Diagnosis not present

## 2012-05-04 DIAGNOSIS — J438 Other emphysema: Secondary | ICD-10-CM | POA: Diagnosis not present

## 2012-05-04 MED ORDER — OXYCODONE-ACETAMINOPHEN 5-325 MG PO TABS: 1.0000 | ORAL_TABLET | Freq: Four times a day (QID) | ORAL | Status: DC | PRN

## 2012-05-04 NOTE — Progress Notes (Signed)
[address]            [place] [address]          [id number]      MIZANI DILDAY [facility] Medical Record #[id number] Date of Birth: [birth year]  Referring: Paulina Fusi, MD Primary Care: Paulina Fusi, MD  Chief Complaint:    Chief Complaint  Patient presents with  . Routine Post Op    1 month f/u with CXR    History of Present Illness:    Patient is a 79 year old female with no previous history of malignancy or history of smoking in the past. She fell at home in late December and scraped her right arm. While in the emergency room in [place] a chest x-ray was obtained that showed pulmonary nodules a subsequent CT scan of the chest and PET scan were obtained. The patient is referred for asymptomatic multiple pulmonary nodules. She does have a long history of rheumatoid arthritis and has been on prednisone started taking September 2013 she is followed by Dr. Azzie Roup for her rheumatoid arthritis on methotrexate and prednisone. She denies any fever or chills or cough. She's had no hemoptysis.  Multiple bilateral pulmonary nodules  now with needle BX postive for  cryptococcus  The patient has had some nausea with the medications infectious disease has modified these and the patient is tolerating treatment better. She's also had her blood pressure medications discontinued because of low blood pressure She had had some headache when she saw Dr. Algis Liming yesterday but she notes this is completely resolved.    Current Activity/ Functional Status: Patient is independent with mobility/ambulation, transfers, ADL's, IADL's.   Past Medical History  Diagnosis Date  . Multiple pulmonary nodules 03/27/2012  . Hiatal hernia   . Rheumatoid arthritis   . Hypertension   . Hyperlipidemia   . Osteoporosis   . CAD (coronary artery disease)   . Spondylosis, lumbosacral   . Hypopotassemia   . GERD (gastroesophageal reflux disease)   . Depression    . Hypotension   . Hyponatremia   . Congestive heart failure   . Osteoarthrosis, hand   . Anemia, iron deficiency   . Diastolic heart failure   . Hypopotassemia   . Hx of colonic polyps     Past Surgical History  Procedure Laterality Date  . Appendectomy    . Total abdominal hysterectomy    . Cholecystectomy    . Spinal stenosis surgery  2004  . Laminectomy      lumber with fusion  . Cataract extraction      insert prosthetic lens  . Hyperplastic polyp removed  201    Dr Jennye Boroughs    Family History  Problem Relation Age of Onset  . Diabetes Mother   . Diabetes Sister   . Diabetes Maternal Grandfather     History   Social History  . Marital Status: Widowed    Spouse Name: N/A    Number of Children: N/A  . Years of Education: N/A   Occupational History  . retired    Social History Main Topics  . Smoking status: Never Smoker   . Smokeless tobacco: Never Used  . Alcohol Use: No  . Drug Use: No  . Sexually Active: Not on file   Other Topics Concern  . Not on file   Social History Narrative  . No narrative on  file    History  Smoking status  . Never Smoker   Smokeless tobacco  . Never Used    History  Alcohol Use No     Allergies  Allergen Reactions  . Iohexol      Code: HIVES, Desc: PT DEVELOPED HIVES AFTER GETTING CONTRAST MEDIA;THIS OCCURED "SEVERAL" YRS. AGO.CT NOTIFIED 03/05/09!, Onset Date: [id number]     Current Outpatient Prescriptions  Medication Sig Dispense Refill  . alendronate (FOSAMAX) 70 MG tablet Take 70 mg by mouth every 7 (seven) days.       Marland Kitchen atorvastatin (LIPITOR) 20 MG tablet Take 1 tablet (20 mg total) by mouth daily.  30 tablet  11  . bethanechol (URECHOLINE) 25 MG tablet Take 25 mg by mouth 3 (three) times daily.      . Calcium-Vitamin D 600-125 MG-UNIT TABS Take by mouth daily.      . cyclobenzaprine (FLEXERIL) 10 MG tablet Take 10 mg by mouth at bedtime.       . ferrous gluconate (FERGON) 325 MG tablet Take 325 mg  by mouth 2 (two) times daily.      . fluconazole (DIFLUCAN) 200 MG tablet Take 2 tablets (400 mg total) by mouth daily.  60 tablet  11  . folic acid (FOLVITE) 1 MG tablet Take 1 mg by mouth daily.      . furosemide (LASIX) 40 MG tablet Take 40 mg by mouth 2 (two) times daily.      Marland Kitchen HYDROcodone-acetaminophen (NORCO) 10-325 MG per tablet Take 1 tablet by mouth every 6 (six) hours as needed.      . loperamide (IMODIUM) 2 MG capsule Take 2 mg by mouth 4 (four) times daily as needed.      Marland Kitchen Lysine HCl 500 MG TABS Take by mouth daily.      . magnesium hydroxide (MILK OF MAGNESIA) 400 MG/5ML suspension Take 15 mLs by mouth daily as needed. For constipation      . methotrexate (RHEUMATREX) 2.5 MG tablet Take 8 mg by mouth once a week.       . Multiple Vitamin (MULTIVITAMIN) capsule Take 1 capsule by mouth daily.      . Omega-3 Fatty Acids (FISH OIL) 1000 MG CAPS Take by mouth daily.      Marland Kitchen omeprazole (PRILOSEC) 20 MG capsule Take 20 mg by mouth daily.      . ondansetron (ZOFRAN) 4 MG tablet Take 1 tablet (4 mg total) by mouth 2 (two) times daily with breakfast and lunch.  20 tablet  0  . potassium chloride SA (K-DUR,KLOR-CON) 20 MEQ tablet Take 20 mEq by mouth daily.       . predniSONE (DELTASONE) 5 MG tablet Take 5 mg by mouth daily.       . psyllium (METAMUCIL) 0.52 G capsule Take 0.52 g by mouth daily.      . ranitidine (ZANTAC) 150 MG tablet Take 150 mg by mouth 2 (two) times daily.      . Tamsulosin HCl (FLOMAX) 0.4 MG CAPS Take 0.4 mg by mouth daily.      . vitamin E 400 UNIT capsule Take 400 Units by mouth daily.       No current facility-administered medications for this visit.       Review of Systems:     Cardiac Review of Systems: Y or N  Chest Pain [  n  ]  Resting SOB [ n  ] Exertional SOB  [ y ]  Myer Peer  ]  Pedal Edema [ mild  ]    Palpitations [ n ] Syncope  [  n]   Presyncope [n   ]  General Review of Systems: [Y] = yes [  ]=no Constitional: recent weight change [  ];  anorexia [  ]; fatigue [place]ienne  ]; nausea [n  ]; night sweats [n  ]; fever [ n ]; or chills [ n ];                                                                                                                                          Dental: poor dentition[ n ]; Last Dentist visit:   Eye : blurred vision [  ]; diplopia [   ]; vision changes [  ];  Amaurosis fugax[  ]; Resp: cough [ n ];  wheezing[n  ];  hemoptysis[n  ]; shortness of breath[ y ]; paroxysmal nocturnal dyspnea[  ]; dyspnea on exertion[y  ]; or orthopnea[  ];  GI:  gallstones[  ], vomiting[  ];  dysphagia[  ]; melena[  ];  hematochezia [  ]; heartburn[  ];   Hx of  Colonoscopy[ y ]; GU: kidney stones [  ]; hematuria[  ];   dysuria [  ];  nocturia[  ];  history of     obstruction [  ];             Skin: rash, swelling[  ];, hair loss[  ];  peripheral edema[  ];  or itching[  ]; Musculosketetal: myalgias[  y];  joint swelling[ y ];  joint erythema[ y ];  joint pain[y ];  back pain[ y ];  Heme/Lymph: bruising[  ];  bleeding[  ];  anemia[  ];  Neuro: TIA[  ];  headaches[  ];  stroke[  ];  vertigo[  ];  seizures[  ];   paresthesias[  ];  difficulty walking[  ];  Psych:depression[  ]; anxiety[  ];  Endocrine: diabetes[  ];  thyroid dysfunction[  ];  Immunizations: Flu [  ]; Pneumococcal[  ];  Other:  Physical Exam: BP 89/52  Pulse 88  Resp 20  Ht 5' (1.524 m)  Wt 106 lb (48.081 kg)  BMI 20.7 kg/m2  SpO2 94%  General appearance: alert, cooperative, appears stated age and no distress Neurologic: intact Heart: regular rate and rhythm, S1, S2 normal, no murmur, click, rub or gallop and normal apical impulse Lungs: clear to auscultation bilaterally and normal percussion bilaterally Abdomen: soft, non-tender; bowel sounds normal; no masses,  no organomegaly Extremities: extremities normal, atraumatic, no cyanosis or edema and Homans sign is negative, no sign of DVT I do not appreciate any cervical or supra-clavicular  adenopathy   Diagnostic Studies & Laboratory data:     Recent Radiology Findings:  02/2012:  RADIOLOGY REPORT*  Clinical Data: Evaluate pulmonary nodules suspected on recent chest x-ray.  CT CHEST WITHOUT  CONTRAST  Technique: Multidetector CT imaging of the chest was performed following the standard protocol without IV contrast.  Comparison: Chest x-ray 03/07/2012.  Findings: The chest wall is unremarkable. No obvious breast masses, supraclavicular or axillary lymphadenopathy. Thyroid gland is mildly enlarged and somewhat nodular likely reflecting thyroid goiter. No discrete nodule or mass. The bony thorax is intact. No destructive bone lesions or spinal canal compromise. Vertebral augmentation changes are noted in the L1 vertebral body.  The heart is normal in size for age. No pericardial effusion. No mediastinal or hilar lymphadenopathy. Dense atherosclerotic calcifications involving the aorta and branch vessels. No focal aneurysm. There is a large hiatal hernia.  Examination of the lung parenchyma confirms multiple pulmonary nodules much greater in the left lung. Ill-defined and somewhat irregular margins. Findings could be due to septic emboli or metastatic pulmonary disease. No focal infiltrates, edema or effusions. The tracheobronchial tree is grossly normal.  The upper abdomen is unremarkable.  IMPRESSION:  1. Multiple bilateral pulmonary nodules suspicious for metastatic disease. Septic emboli also possible but much less likely. 2. No mediastinal or hilar lymphadenopathy. 3. Large hiatal hernia.  PET 03/2012: NUCLEAR MEDICINE PET SKULL BASE TO THIGH  Fasting Blood Glucose: 90  Technique: 13 mCi F-18 FDG was injected intravenously. CT data was obtained and used for attenuation correction and anatomic localization only. (This was not acquired as a diagnostic CT examination.) Additional exam technical data entered on technologist worksheet.  Comparison:  03/07/2012  Findings:  Neck: No hypermetabolic lymph nodes within the neck. Diffuse increased uptake is identified throughout both lobes of the thyroid gland.  Chest: Left upper lobe nodule measures 1.1 cm and has an SUV max equal to 2.8, image 82. The subpleural left upper lobe nodule measures 1.6 cm and has an SUV max equal to 4.6, image 71. Hypermetabolic nodule in the right apex measures 8 mm and has an SUV max equal to 2.19, image 66. Right upper lobe nodule measures 4 mm. Too small to characterize. The superior segment of the left lower lobe nodule measures 3 mm, image 75. This is also too small to characterize. No hypermetabolic mediastinal or hilar lymph nodes. There is a large hiatal hernia. The heart size is enlarged. No pericardial effusion. Prominent coronary artery calcifications involve the left main, LAD, left circumflex and RCA coronary arteries.  Abdomen/Pelvis: No abnormal hypermetabolic activity within the liver, pancreas, adrenal glands, or spleen. No hypermetabolic lymph nodes in the abdomen or pelvis.  Skeleton: No focal hypermetabolic activity to suggest skeletal metastasis.  IMPRESSION:  1. There are three nodules within the left upper lobe which exhibit malignant range FDG uptake and are worrisome for multi centric bronchogenic carcinoma. 2. Prominent coronary artery calcifications. 3. Diffuse uptake throughout the thyroid gland suspicious for a thyroiditis. 4. Large hiatal hernia.   Original Report Authenticated By: Signa Kell, M.D.       02/2009 Clinical Data: Suspicion for left upper lobe nodule on CXR  CT CHEST WITH CONTRAST  Technique: Multidetector CT imaging of the chest was performed  following the standard protocol during bolus administration of  intravenous contrast.  Contrast: 100 ml Omnipaque-300 IV. The patient received 13 hour  prep for hive allergy.  Comparison: CT 01/22/2009  Findings: The previously noted 7 x 16 mm  nodular focus in the  posterior aspect left upper lobe has resolved. There is a  calcified granuloma in the posterior segment of the left upper  lobe. No change in two small nodules in the lateral aspect  of the  right middle lobe (image 34). They measure approximately 5 mm in  diameter. Stable 3 x 4 mm subpleural nodule in the right upper  lobe (image 20). Stable small ground-glass opacity in the left  midlung field of (image 26).  Large hiatal hernia. Thyroid gland is again noted to be prominent  in size. No pathologically enlarged lymph nodes. Slight  calcification of the LAD coronary artery branch. Mild biliary  ductal dilatation. This was not present on the prior exam.  Recommend correlation with LFTs.  IMPRESSION:  Resolution of the previously noted nodular lesion in the left upper  lobe. Stable small nodules described above. Mild biliary ductal  dilatation. Recommend correlation with LFTs.   Recent Lab Findings: Lab Results  Component Value Date   WBC 10.4 05/03/2012   HGB 13.2 05/03/2012   HCT 38.8 05/03/2012   PLT 218 05/03/2012   GLUCOSE 109* 05/03/2012   ALT 17 05/03/2012   AST 27 05/03/2012   NA 134* 05/03/2012   K 5.0 05/03/2012   CL 96 05/03/2012   CREATININE 1.12* 05/03/2012   BUN 32* 05/03/2012   CO2 28 05/03/2012   INR 0.95 04/03/2012      Assessment / Plan:   1  Multiple bilateral pulmonary nodules   now with needle BX postive for  cryptococcus 2 Large hiatal hernia 3 Prominent coronary artery calcifications. 4  Diffuse uptake throughout the thyroid gland suspicious for a thyroiditis. 5 Rheumatoid    Atrthritis  The patient's radiograph appears stable on current antifungal therapy. I've not made a return appointment to see me because she is closely followed in infectious disease clinic. I've asked her to have a repeat chest x-ray when she returns to see Dr. Algis Liming in May. If there is any progression on her chest x-ray would be glad to see her at any  time.    Delight Ovens MD  Beeper [phone] Office [phone] 05/04/2012 10:51 AM

## 2012-05-04 NOTE — Progress Notes (Deleted)
[address]       [place] [address]             [phone]       [name] Parsons [facility] Medical Record #[id number] Date of Birth: 07/28/1919  Referring: Paulina Fusi, MD Primary Care: Paulina Fusi, MD  Chief Complaint:    Chief Complaint  Patient presents with  . Routine Post Op    1 month f/u with CXR    History of Present Illness:    Patient is a 79 year old female with no previous history of malignancy or history of smoking in the past. She fell at home in late December and scraped her right arm. While in the emergency room in [place] a chest x-ray was obtained that showed pulmonary nodules a subsequent CT scan of the chest and PET scan were obtained. The patient is referred for asymptomatic multiple pulmonary nodules. She does have a long history of rheumatoid arthritis and has been on prednisone started taking September 2013 she is followed by Dr. Azzie Roup for her rheumatoid arthritis on methotrexate and prednisone. She denies any fever or chills or cough. She's had no hemoptysis.   Patient returns today after needle bx of lung mass done    Current Activity/ Functional Status: Patient is independent with mobility/ambulation, transfers, ADL's, IADL's.   Past Medical History  Diagnosis Date  . Multiple pulmonary nodules 03/27/2012  . Hiatal hernia   . Rheumatoid arthritis   . Hypertension   . Hyperlipidemia   . Osteoporosis   . CAD (coronary artery disease)   . Spondylosis, lumbosacral   . Hypopotassemia   . GERD (gastroesophageal reflux disease)   . Depression   . Hypotension   . Hyponatremia   . Congestive heart failure   . Osteoarthrosis, hand   . Anemia, iron deficiency   . Diastolic heart failure   . Hypopotassemia   . Hx of colonic polyps     Past Surgical History  Procedure Laterality Date  . Appendectomy    . Total abdominal hysterectomy    . Cholecystectomy    . Spinal stenosis surgery   2004  . Laminectomy      lumber with fusion  . Cataract extraction      insert prosthetic lens  . Hyperplastic polyp removed  201    Dr Jennye Boroughs    Family History  Problem Relation Age of Onset  . Diabetes Mother   . Diabetes Sister   . Diabetes Maternal Grandfather     History   Social History  . Marital Status: Widowed    Spouse Name: N/A    Number of Children: N/A  . Years of Education: N/A   Occupational History  . retired    Social History Main Topics  . Smoking status: Never Smoker   . Smokeless tobacco: Never Used  . Alcohol Use: No  . Drug Use: No  . Sexually Active: Not on file   Other Topics Concern  . Not on file   Social History Narrative  . No narrative on file    History  Smoking status  . Never Smoker   Smokeless tobacco  . Never Used    History  Alcohol Use No     Allergies  Allergen Reactions  . Iohexol      Code: HIVES, Desc: PT DEVELOPED HIVES AFTER GETTING CONTRAST MEDIA;THIS OCCURED "  SEVERAL" YRS. AGO.CT NOTIFIED 03/05/09!, Onset Date: [id number]     Current Outpatient Prescriptions  Medication Sig Dispense Refill  . alendronate (FOSAMAX) 70 MG tablet Take 70 mg by mouth every 7 (seven) days.       Marland Kitchen atorvastatin (LIPITOR) 20 MG tablet Take 1 tablet (20 mg total) by mouth daily.  30 tablet  11  . bethanechol (URECHOLINE) 25 MG tablet Take 25 mg by mouth 3 (three) times daily.      . Calcium-Vitamin D 600-125 MG-UNIT TABS Take by mouth daily.      . cyclobenzaprine (FLEXERIL) 10 MG tablet Take 10 mg by mouth at bedtime.       . ferrous gluconate (FERGON) 325 MG tablet Take 325 mg by mouth 2 (two) times daily.      . fluconazole (DIFLUCAN) 200 MG tablet Take 2 tablets (400 mg total) by mouth daily.  60 tablet  11  . folic acid (FOLVITE) 1 MG tablet Take 1 mg by mouth daily.      . furosemide (LASIX) 40 MG tablet Take 40 mg by mouth 2 (two) times daily.      Marland Kitchen HYDROcodone-acetaminophen (NORCO) 10-325 MG per tablet Take 1 tablet  by mouth every 6 (six) hours as needed.      . loperamide (IMODIUM) 2 MG capsule Take 2 mg by mouth 4 (four) times daily as needed.      Marland Kitchen Lysine HCl 500 MG TABS Take by mouth daily.      . magnesium hydroxide (MILK OF MAGNESIA) 400 MG/5ML suspension Take 15 mLs by mouth daily as needed. For constipation      . methotrexate (RHEUMATREX) 2.5 MG tablet Take 8 mg by mouth once a week.       . Multiple Vitamin (MULTIVITAMIN) capsule Take 1 capsule by mouth daily.      . Omega-3 Fatty Acids (FISH OIL) 1000 MG CAPS Take by mouth daily.      Marland Kitchen omeprazole (PRILOSEC) 20 MG capsule Take 20 mg by mouth daily.      . ondansetron (ZOFRAN) 4 MG tablet Take 1 tablet (4 mg total) by mouth 2 (two) times daily with breakfast and lunch.  20 tablet  0  . potassium chloride SA (K-DUR,KLOR-CON) 20 MEQ tablet Take 20 mEq by mouth daily.       . predniSONE (DELTASONE) 5 MG tablet Take 5 mg by mouth daily.       . psyllium (METAMUCIL) 0.52 G capsule Take 0.52 g by mouth daily.      . ranitidine (ZANTAC) 150 MG tablet Take 150 mg by mouth 2 (two) times daily.      . Tamsulosin HCl (FLOMAX) 0.4 MG CAPS Take 0.4 mg by mouth daily.      . vitamin E 400 UNIT capsule Take 400 Units by mouth daily.       No current facility-administered medications for this visit.       Review of Systems:     Cardiac Review of Systems: Y or N  Chest Pain [  n  ]  Resting SOB [ n  ] Exertional SOB  [ y ]  Pollyann Kennedy Milo.Brash  ]   Pedal Edema [ mild  ]    Palpitations [ n ] Syncope  [  n]   Presyncope [n   ]  General Review of Systems: [Y] = yes [  ]=no Constitional: recent weight change [  ]; anorexia [  ]; fatigue Cove.Etienne  ]; nausea [n  ];  night sweats [n  ]; fever [ n ]; or chills [ n ];                                                                                                                                          Dental: poor dentition[ n ]; Last Dentist visit:   Eye : blurred vision [  ]; diplopia [   ]; vision changes [  ];  Amaurosis  fugax[  ]; Resp: cough [ n ];  wheezing[n  ];  hemoptysis[n  ]; shortness of breath[ y ]; paroxysmal nocturnal dyspnea[  ]; dyspnea on exertion[y  ]; or orthopnea[  ];  GI:  gallstones[  ], vomiting[  ];  dysphagia[  ]; melena[  ];  hematochezia [  ]; heartburn[  ];   Hx of  Colonoscopy[ y ]; GU: kidney stones [  ]; hematuria[  ];   dysuria [  ];  nocturia[  ];  history of     obstruction [  ];             Skin: rash, swelling[  ];, hair loss[  ];  peripheral edema[  ];  or itching[  ]; Musculosketetal: myalgias[  y];  joint swelling[ y ];  joint erythema[ y ];  joint pain[y ];  back pain[ y ];  Heme/Lymph: bruising[  ];  bleeding[  ];  anemia[  ];  Neuro: TIA[  ];  headaches[  ];  stroke[  ];  vertigo[  ];  seizures[  ];   paresthesias[  ];  difficulty walking[  ];  Psych:depression[  ]; anxiety[  ];  Endocrine: diabetes[  ];  thyroid dysfunction[  ];  Immunizations: Flu [  ]; Pneumococcal[  ];  Other:  Physical Exam: BP 89/52  Pulse 88  Resp 20  Ht 5' (1.524 m)  Wt 106 lb (48.081 kg)  BMI 20.7 kg/m2  SpO2 94%  General appearance: alert, cooperative, appears stated age and no distress Neurologic: intact Heart: regular rate and rhythm, S1, S2 normal, no murmur, click, rub or gallop and normal apical impulse Lungs: clear to auscultation bilaterally and normal percussion bilaterally Abdomen: soft, non-tender; bowel sounds normal; no masses,  no organomegaly Extremities: extremities normal, atraumatic, no cyanosis or edema and Homans sign is negative, no sign of DVT I do not appreciate any cervical or supra-clavicular adenopathy   Diagnostic Studies & Laboratory data:     Recent Radiology Findings:  02/2012:  RADIOLOGY REPORT*  Clinical Data: Evaluate pulmonary nodules suspected on recent chest x-ray.  CT CHEST WITHOUT CONTRAST  Technique: Multidetector CT imaging of the chest was performed following the standard protocol without IV contrast.  Comparison: Chest x-ray  03/07/2012.  Findings: The chest wall is unremarkable. No obvious breast masses, supraclavicular or axillary lymphadenopathy. Thyroid gland is mildly enlarged and somewhat nodular likely reflecting thyroid goiter. No discrete nodule or mass.  The bony thorax is intact. No destructive bone lesions or spinal canal compromise. Vertebral augmentation changes are noted in the L1 vertebral body.  The heart is normal in size for age. No pericardial effusion. No mediastinal or hilar lymphadenopathy. Dense atherosclerotic calcifications involving the aorta and branch vessels. No focal aneurysm. There is a large hiatal hernia.  Examination of the lung parenchyma confirms multiple pulmonary nodules much greater in the left lung. Ill-defined and somewhat irregular margins. Findings could be due to septic emboli or metastatic pulmonary disease. No focal infiltrates, edema or effusions. The tracheobronchial tree is grossly normal.  The upper abdomen is unremarkable.  IMPRESSION:  1. Multiple bilateral pulmonary nodules suspicious for metastatic disease. Septic emboli also possible but much less likely. 2. No mediastinal or hilar lymphadenopathy. 3. Large hiatal hernia.  PET 03/2012: NUCLEAR MEDICINE PET SKULL BASE TO THIGH  Fasting Blood Glucose: 90  Technique: 13 mCi F-18 FDG was injected intravenously. CT data was obtained and used for attenuation correction and anatomic localization only. (This was not acquired as a diagnostic CT examination.) Additional exam technical data entered on technologist worksheet.  Comparison: 03/07/2012  Findings:  Neck: No hypermetabolic lymph nodes within the neck. Diffuse increased uptake is identified throughout both lobes of the thyroid gland.  Chest: Left upper lobe nodule measures 1.1 cm and has an SUV max equal to 2.8, image 82. The subpleural left upper lobe nodule measures 1.6 cm and has an SUV max equal to 4.6, image 71. Hypermetabolic  nodule in the right apex measures 8 mm and has an SUV max equal to 2.19, image 66. Right upper lobe nodule measures 4 mm. Too small to characterize. The superior segment of the left lower lobe nodule measures 3 mm, image 75. This is also too small to characterize. No hypermetabolic mediastinal or hilar lymph nodes. There is a large hiatal hernia. The heart size is enlarged. No pericardial effusion. Prominent coronary artery calcifications involve the left main, LAD, left circumflex and RCA coronary arteries.  Abdomen/Pelvis: No abnormal hypermetabolic activity within the liver, pancreas, adrenal glands, or spleen. No hypermetabolic lymph nodes in the abdomen or pelvis.  Skeleton: No focal hypermetabolic activity to suggest skeletal metastasis.  IMPRESSION:  1. There are three nodules within the left upper lobe which exhibit malignant range FDG uptake and are worrisome for multi centric bronchogenic carcinoma. 2. Prominent coronary artery calcifications. 3. Diffuse uptake throughout the thyroid gland suspicious for a thyroiditis. 4. Large hiatal hernia.   Original Report Authenticated By: Signa Kell, M.D.       02/2009 Clinical Data: Suspicion for left upper lobe nodule on CXR  CT CHEST WITH CONTRAST  Technique: Multidetector CT imaging of the chest was performed  following the standard protocol during bolus administration of  intravenous contrast.  Contrast: 100 ml Omnipaque-300 IV. The patient received 13 hour  prep for hive allergy.  Comparison: CT 01/22/2009  Findings: The previously noted 7 x 16 mm nodular focus in the  posterior aspect left upper lobe has resolved. There is a  calcified granuloma in the posterior segment of the left upper  lobe. No change in two small nodules in the lateral aspect of the  right middle lobe (image 34). They measure approximately 5 mm in  diameter. Stable 3 x 4 mm subpleural nodule in the right upper  lobe (image 20). Stable  small ground-glass opacity in the left  midlung field of (image 26).  Large hiatal hernia. Thyroid gland is again noted to be  prominent  in size. No pathologically enlarged lymph nodes. Slight  calcification of the LAD coronary artery branch. Mild biliary  ductal dilatation. This was not present on the prior exam.  Recommend correlation with LFTs.  IMPRESSION:  Resolution of the previously noted nodular lesion in the left upper  lobe. Stable small nodules described above. Mild biliary ductal  dilatation. Recommend correlation with LFTs.   Recent Lab Findings: Lab Results  Component Value Date   WBC 10.4 05/03/2012   HGB 13.2 05/03/2012   HCT 38.8 05/03/2012   PLT 218 05/03/2012   GLUCOSE 109* 05/03/2012   ALT 17 05/03/2012   AST 27 05/03/2012   NA 134* 05/03/2012   K 5.0 05/03/2012   CL 96 05/03/2012   CREATININE 1.12* 05/03/2012   BUN 32* 05/03/2012   CO2 28 05/03/2012   INR 0.95 04/03/2012      Assessment / Plan:   1  Multiple bilateral pulmonary nodules   now with needle BX postive for  cryptococcus 2 Large hiatal hernia 3 Prominent coronary artery calcifications. 4  Diffuse uptake throughout the thyroid gland suspicious for a thyroiditis. 5 Rheumatoid    Atrthritis   Patient referred to ID clinic and will be seen today I will see back in one month with chest xray   Delight Ovens MD  Beeper [phone] Office [phone] 05/04/2012 10:34 AM

## 2012-05-11 ENCOUNTER — Encounter: Payer: Self-pay | Admitting: Infectious Disease

## 2012-05-26 DIAGNOSIS — M069 Rheumatoid arthritis, unspecified: Secondary | ICD-10-CM | POA: Diagnosis not present

## 2012-05-26 DIAGNOSIS — M810 Age-related osteoporosis without current pathological fracture: Secondary | ICD-10-CM | POA: Diagnosis not present

## 2012-06-10 DIAGNOSIS — M818 Other osteoporosis without current pathological fracture: Secondary | ICD-10-CM | POA: Diagnosis present

## 2012-06-10 DIAGNOSIS — I509 Heart failure, unspecified: Secondary | ICD-10-CM | POA: Diagnosis not present

## 2012-06-10 DIAGNOSIS — M25519 Pain in unspecified shoulder: Secondary | ICD-10-CM | POA: Diagnosis not present

## 2012-06-10 DIAGNOSIS — M84459D Pathological fracture, hip, unspecified, subsequent encounter for fracture with routine healing: Secondary | ICD-10-CM | POA: Diagnosis not present

## 2012-06-10 DIAGNOSIS — S72143A Displaced intertrochanteric fracture of unspecified femur, initial encounter for closed fracture: Secondary | ICD-10-CM | POA: Diagnosis not present

## 2012-06-10 DIAGNOSIS — M069 Rheumatoid arthritis, unspecified: Secondary | ICD-10-CM | POA: Diagnosis not present

## 2012-06-10 DIAGNOSIS — I10 Essential (primary) hypertension: Secondary | ICD-10-CM | POA: Diagnosis not present

## 2012-06-10 DIAGNOSIS — IMO0002 Reserved for concepts with insufficient information to code with codable children: Secondary | ICD-10-CM | POA: Diagnosis not present

## 2012-06-10 DIAGNOSIS — R296 Repeated falls: Secondary | ICD-10-CM | POA: Diagnosis not present

## 2012-06-10 DIAGNOSIS — M810 Age-related osteoporosis without current pathological fracture: Secondary | ICD-10-CM | POA: Diagnosis not present

## 2012-06-10 DIAGNOSIS — E785 Hyperlipidemia, unspecified: Secondary | ICD-10-CM | POA: Diagnosis present

## 2012-06-10 DIAGNOSIS — B459 Cryptococcosis, unspecified: Secondary | ICD-10-CM | POA: Diagnosis not present

## 2012-06-10 DIAGNOSIS — M2550 Pain in unspecified joint: Secondary | ICD-10-CM | POA: Diagnosis not present

## 2012-06-10 DIAGNOSIS — R918 Other nonspecific abnormal finding of lung field: Secondary | ICD-10-CM | POA: Diagnosis not present

## 2012-06-10 DIAGNOSIS — K219 Gastro-esophageal reflux disease without esophagitis: Secondary | ICD-10-CM | POA: Diagnosis not present

## 2012-06-10 DIAGNOSIS — Z79899 Other long term (current) drug therapy: Secondary | ICD-10-CM | POA: Diagnosis not present

## 2012-06-10 DIAGNOSIS — S72009A Fracture of unspecified part of neck of unspecified femur, initial encounter for closed fracture: Secondary | ICD-10-CM | POA: Diagnosis not present

## 2012-06-10 DIAGNOSIS — E860 Dehydration: Secondary | ICD-10-CM | POA: Diagnosis not present

## 2012-06-10 DIAGNOSIS — M25559 Pain in unspecified hip: Secondary | ICD-10-CM | POA: Diagnosis not present

## 2012-06-10 DIAGNOSIS — Z87828 Personal history of other (healed) physical injury and trauma: Secondary | ICD-10-CM | POA: Diagnosis not present

## 2012-06-10 DIAGNOSIS — D500 Iron deficiency anemia secondary to blood loss (chronic): Secondary | ICD-10-CM | POA: Diagnosis not present

## 2012-06-13 DIAGNOSIS — M25559 Pain in unspecified hip: Secondary | ICD-10-CM | POA: Diagnosis not present

## 2012-06-13 DIAGNOSIS — M8448XA Pathological fracture, other site, initial encounter for fracture: Secondary | ICD-10-CM | POA: Diagnosis not present

## 2012-06-13 DIAGNOSIS — J9620 Acute and chronic respiratory failure, unspecified whether with hypoxia or hypercapnia: Secondary | ICD-10-CM | POA: Diagnosis not present

## 2012-06-13 DIAGNOSIS — M2550 Pain in unspecified joint: Secondary | ICD-10-CM | POA: Diagnosis not present

## 2012-06-13 DIAGNOSIS — J9610 Chronic respiratory failure, unspecified whether with hypoxia or hypercapnia: Secondary | ICD-10-CM | POA: Diagnosis not present

## 2012-06-13 DIAGNOSIS — E860 Dehydration: Secondary | ICD-10-CM | POA: Diagnosis not present

## 2012-06-13 DIAGNOSIS — E785 Hyperlipidemia, unspecified: Secondary | ICD-10-CM | POA: Diagnosis not present

## 2012-06-13 DIAGNOSIS — M069 Rheumatoid arthritis, unspecified: Secondary | ICD-10-CM | POA: Diagnosis not present

## 2012-06-13 DIAGNOSIS — D649 Anemia, unspecified: Secondary | ICD-10-CM | POA: Diagnosis not present

## 2012-06-13 DIAGNOSIS — M810 Age-related osteoporosis without current pathological fracture: Secondary | ICD-10-CM | POA: Diagnosis not present

## 2012-06-13 DIAGNOSIS — S72143A Displaced intertrochanteric fracture of unspecified femur, initial encounter for closed fracture: Secondary | ICD-10-CM | POA: Diagnosis not present

## 2012-06-13 DIAGNOSIS — B459 Cryptococcosis, unspecified: Secondary | ICD-10-CM | POA: Diagnosis not present

## 2012-06-13 DIAGNOSIS — G8918 Other acute postprocedural pain: Secondary | ICD-10-CM | POA: Diagnosis not present

## 2012-06-13 DIAGNOSIS — I10 Essential (primary) hypertension: Secondary | ICD-10-CM | POA: Diagnosis not present

## 2012-06-13 DIAGNOSIS — R269 Unspecified abnormalities of gait and mobility: Secondary | ICD-10-CM | POA: Diagnosis not present

## 2012-06-13 DIAGNOSIS — M84459D Pathological fracture, hip, unspecified, subsequent encounter for fracture with routine healing: Secondary | ICD-10-CM | POA: Diagnosis not present

## 2012-06-13 DIAGNOSIS — R918 Other nonspecific abnormal finding of lung field: Secondary | ICD-10-CM | POA: Diagnosis not present

## 2012-06-13 DIAGNOSIS — D638 Anemia in other chronic diseases classified elsewhere: Secondary | ICD-10-CM | POA: Diagnosis not present

## 2012-06-13 DIAGNOSIS — S72009A Fracture of unspecified part of neck of unspecified femur, initial encounter for closed fracture: Secondary | ICD-10-CM | POA: Diagnosis not present

## 2012-06-13 DIAGNOSIS — K219 Gastro-esophageal reflux disease without esophagitis: Secondary | ICD-10-CM | POA: Diagnosis not present

## 2012-06-15 DIAGNOSIS — D638 Anemia in other chronic diseases classified elsewhere: Secondary | ICD-10-CM | POA: Diagnosis not present

## 2012-06-15 DIAGNOSIS — M8448XA Pathological fracture, other site, initial encounter for fracture: Secondary | ICD-10-CM | POA: Diagnosis not present

## 2012-06-15 DIAGNOSIS — J9620 Acute and chronic respiratory failure, unspecified whether with hypoxia or hypercapnia: Secondary | ICD-10-CM | POA: Diagnosis not present

## 2012-06-15 DIAGNOSIS — S72143A Displaced intertrochanteric fracture of unspecified femur, initial encounter for closed fracture: Secondary | ICD-10-CM | POA: Diagnosis not present

## 2012-06-19 DIAGNOSIS — S72143A Displaced intertrochanteric fracture of unspecified femur, initial encounter for closed fracture: Secondary | ICD-10-CM | POA: Diagnosis not present

## 2012-07-14 DIAGNOSIS — R269 Unspecified abnormalities of gait and mobility: Secondary | ICD-10-CM | POA: Diagnosis not present

## 2012-07-14 DIAGNOSIS — Z9181 History of falling: Secondary | ICD-10-CM | POA: Diagnosis not present

## 2012-07-14 DIAGNOSIS — L8992 Pressure ulcer of unspecified site, stage 2: Secondary | ICD-10-CM | POA: Diagnosis not present

## 2012-07-14 DIAGNOSIS — I10 Essential (primary) hypertension: Secondary | ICD-10-CM | POA: Diagnosis not present

## 2012-07-14 DIAGNOSIS — E785 Hyperlipidemia, unspecified: Secondary | ICD-10-CM | POA: Diagnosis not present

## 2012-07-14 DIAGNOSIS — Z1331 Encounter for screening for depression: Secondary | ICD-10-CM | POA: Diagnosis not present

## 2012-07-14 DIAGNOSIS — S72143A Displaced intertrochanteric fracture of unspecified femur, initial encounter for closed fracture: Secondary | ICD-10-CM | POA: Diagnosis not present

## 2012-07-14 DIAGNOSIS — L89109 Pressure ulcer of unspecified part of back, unspecified stage: Secondary | ICD-10-CM | POA: Diagnosis not present

## 2012-07-14 DIAGNOSIS — S72009D Fracture of unspecified part of neck of unspecified femur, subsequent encounter for closed fracture with routine healing: Secondary | ICD-10-CM | POA: Diagnosis not present

## 2012-07-14 DIAGNOSIS — D509 Iron deficiency anemia, unspecified: Secondary | ICD-10-CM | POA: Diagnosis not present

## 2012-07-16 DIAGNOSIS — R269 Unspecified abnormalities of gait and mobility: Secondary | ICD-10-CM | POA: Diagnosis not present

## 2012-07-16 DIAGNOSIS — L89109 Pressure ulcer of unspecified part of back, unspecified stage: Secondary | ICD-10-CM | POA: Diagnosis not present

## 2012-07-16 DIAGNOSIS — S72009D Fracture of unspecified part of neck of unspecified femur, subsequent encounter for closed fracture with routine healing: Secondary | ICD-10-CM | POA: Diagnosis not present

## 2012-07-16 DIAGNOSIS — L8992 Pressure ulcer of unspecified site, stage 2: Secondary | ICD-10-CM | POA: Diagnosis not present

## 2012-07-17 DIAGNOSIS — R269 Unspecified abnormalities of gait and mobility: Secondary | ICD-10-CM | POA: Diagnosis not present

## 2012-07-17 DIAGNOSIS — L8992 Pressure ulcer of unspecified site, stage 2: Secondary | ICD-10-CM | POA: Diagnosis not present

## 2012-07-17 DIAGNOSIS — S72009D Fracture of unspecified part of neck of unspecified femur, subsequent encounter for closed fracture with routine healing: Secondary | ICD-10-CM | POA: Diagnosis not present

## 2012-07-17 DIAGNOSIS — L89109 Pressure ulcer of unspecified part of back, unspecified stage: Secondary | ICD-10-CM | POA: Diagnosis not present

## 2012-07-18 DIAGNOSIS — R269 Unspecified abnormalities of gait and mobility: Secondary | ICD-10-CM | POA: Diagnosis not present

## 2012-07-18 DIAGNOSIS — S72009D Fracture of unspecified part of neck of unspecified femur, subsequent encounter for closed fracture with routine healing: Secondary | ICD-10-CM | POA: Diagnosis not present

## 2012-07-18 DIAGNOSIS — L89109 Pressure ulcer of unspecified part of back, unspecified stage: Secondary | ICD-10-CM | POA: Diagnosis not present

## 2012-07-18 DIAGNOSIS — L8992 Pressure ulcer of unspecified site, stage 2: Secondary | ICD-10-CM | POA: Diagnosis not present

## 2012-07-20 DIAGNOSIS — L89109 Pressure ulcer of unspecified part of back, unspecified stage: Secondary | ICD-10-CM | POA: Diagnosis not present

## 2012-07-20 DIAGNOSIS — S72009D Fracture of unspecified part of neck of unspecified femur, subsequent encounter for closed fracture with routine healing: Secondary | ICD-10-CM | POA: Diagnosis not present

## 2012-07-20 DIAGNOSIS — R269 Unspecified abnormalities of gait and mobility: Secondary | ICD-10-CM | POA: Diagnosis not present

## 2012-07-20 DIAGNOSIS — L8992 Pressure ulcer of unspecified site, stage 2: Secondary | ICD-10-CM | POA: Diagnosis not present

## 2012-07-21 DIAGNOSIS — R269 Unspecified abnormalities of gait and mobility: Secondary | ICD-10-CM | POA: Diagnosis not present

## 2012-07-21 DIAGNOSIS — L89109 Pressure ulcer of unspecified part of back, unspecified stage: Secondary | ICD-10-CM | POA: Diagnosis not present

## 2012-07-21 DIAGNOSIS — S72009D Fracture of unspecified part of neck of unspecified femur, subsequent encounter for closed fracture with routine healing: Secondary | ICD-10-CM | POA: Diagnosis not present

## 2012-07-21 DIAGNOSIS — L8992 Pressure ulcer of unspecified site, stage 2: Secondary | ICD-10-CM | POA: Diagnosis not present

## 2012-07-24 DIAGNOSIS — S72009D Fracture of unspecified part of neck of unspecified femur, subsequent encounter for closed fracture with routine healing: Secondary | ICD-10-CM | POA: Diagnosis not present

## 2012-07-24 DIAGNOSIS — L8992 Pressure ulcer of unspecified site, stage 2: Secondary | ICD-10-CM | POA: Diagnosis not present

## 2012-07-24 DIAGNOSIS — R269 Unspecified abnormalities of gait and mobility: Secondary | ICD-10-CM | POA: Diagnosis not present

## 2012-07-24 DIAGNOSIS — L89109 Pressure ulcer of unspecified part of back, unspecified stage: Secondary | ICD-10-CM | POA: Diagnosis not present

## 2012-07-25 DIAGNOSIS — S72009D Fracture of unspecified part of neck of unspecified femur, subsequent encounter for closed fracture with routine healing: Secondary | ICD-10-CM | POA: Diagnosis not present

## 2012-07-25 DIAGNOSIS — L8992 Pressure ulcer of unspecified site, stage 2: Secondary | ICD-10-CM | POA: Diagnosis not present

## 2012-07-25 DIAGNOSIS — L89109 Pressure ulcer of unspecified part of back, unspecified stage: Secondary | ICD-10-CM | POA: Diagnosis not present

## 2012-07-25 DIAGNOSIS — R269 Unspecified abnormalities of gait and mobility: Secondary | ICD-10-CM | POA: Diagnosis not present

## 2012-07-26 DIAGNOSIS — S72009D Fracture of unspecified part of neck of unspecified femur, subsequent encounter for closed fracture with routine healing: Secondary | ICD-10-CM | POA: Diagnosis not present

## 2012-07-26 DIAGNOSIS — R269 Unspecified abnormalities of gait and mobility: Secondary | ICD-10-CM | POA: Diagnosis not present

## 2012-07-26 DIAGNOSIS — L89109 Pressure ulcer of unspecified part of back, unspecified stage: Secondary | ICD-10-CM | POA: Diagnosis not present

## 2012-07-26 DIAGNOSIS — L8992 Pressure ulcer of unspecified site, stage 2: Secondary | ICD-10-CM | POA: Diagnosis not present

## 2012-07-27 DIAGNOSIS — L8992 Pressure ulcer of unspecified site, stage 2: Secondary | ICD-10-CM | POA: Diagnosis not present

## 2012-07-27 DIAGNOSIS — S72009D Fracture of unspecified part of neck of unspecified femur, subsequent encounter for closed fracture with routine healing: Secondary | ICD-10-CM | POA: Diagnosis not present

## 2012-07-27 DIAGNOSIS — S72143A Displaced intertrochanteric fracture of unspecified femur, initial encounter for closed fracture: Secondary | ICD-10-CM | POA: Diagnosis not present

## 2012-07-27 DIAGNOSIS — R269 Unspecified abnormalities of gait and mobility: Secondary | ICD-10-CM | POA: Diagnosis not present

## 2012-07-27 DIAGNOSIS — L89109 Pressure ulcer of unspecified part of back, unspecified stage: Secondary | ICD-10-CM | POA: Diagnosis not present

## 2012-07-28 DIAGNOSIS — L8992 Pressure ulcer of unspecified site, stage 2: Secondary | ICD-10-CM | POA: Diagnosis not present

## 2012-07-28 DIAGNOSIS — L89109 Pressure ulcer of unspecified part of back, unspecified stage: Secondary | ICD-10-CM | POA: Diagnosis not present

## 2012-07-28 DIAGNOSIS — R269 Unspecified abnormalities of gait and mobility: Secondary | ICD-10-CM | POA: Diagnosis not present

## 2012-07-28 DIAGNOSIS — S72009D Fracture of unspecified part of neck of unspecified femur, subsequent encounter for closed fracture with routine healing: Secondary | ICD-10-CM | POA: Diagnosis not present

## 2012-07-31 DIAGNOSIS — L8992 Pressure ulcer of unspecified site, stage 2: Secondary | ICD-10-CM | POA: Diagnosis not present

## 2012-07-31 DIAGNOSIS — S72009D Fracture of unspecified part of neck of unspecified femur, subsequent encounter for closed fracture with routine healing: Secondary | ICD-10-CM | POA: Diagnosis not present

## 2012-07-31 DIAGNOSIS — R269 Unspecified abnormalities of gait and mobility: Secondary | ICD-10-CM | POA: Diagnosis not present

## 2012-07-31 DIAGNOSIS — L89109 Pressure ulcer of unspecified part of back, unspecified stage: Secondary | ICD-10-CM | POA: Diagnosis not present

## 2012-08-02 ENCOUNTER — Ambulatory Visit (INDEPENDENT_AMBULATORY_CARE_PROVIDER_SITE_OTHER): Payer: Medicare Other | Admitting: Infectious Disease

## 2012-08-02 ENCOUNTER — Other Ambulatory Visit: Payer: Self-pay | Admitting: *Deleted

## 2012-08-02 ENCOUNTER — Encounter: Payer: Self-pay | Admitting: Infectious Disease

## 2012-08-02 ENCOUNTER — Ambulatory Visit
Admission: RE | Admit: 2012-08-02 | Discharge: 2012-08-02 | Disposition: A | Discharge status: Home / Self Care | Payer: Medicare Other | Source: Ambulatory Visit | Attending: Cardiothoracic Surgery | Admitting: Cardiothoracic Surgery

## 2012-08-02 VITALS — BP 123/65 | HR 88 | Temp 97.6°F | Ht 60.0 in | Wt 102.0 lb

## 2012-08-02 DIAGNOSIS — E785 Hyperlipidemia, unspecified: Secondary | ICD-10-CM

## 2012-08-02 DIAGNOSIS — D381 Neoplasm of uncertain behavior of trachea, bronchus and lung: Secondary | ICD-10-CM

## 2012-08-02 DIAGNOSIS — M069 Rheumatoid arthritis, unspecified: Secondary | ICD-10-CM | POA: Diagnosis not present

## 2012-08-02 DIAGNOSIS — R05 Cough: Secondary | ICD-10-CM | POA: Diagnosis not present

## 2012-08-02 DIAGNOSIS — I011 Acute rheumatic endocarditis: Secondary | ICD-10-CM

## 2012-08-02 DIAGNOSIS — S72009S Fracture of unspecified part of neck of unspecified femur, sequela: Secondary | ICD-10-CM

## 2012-08-02 DIAGNOSIS — B459 Cryptococcosis, unspecified: Secondary | ICD-10-CM

## 2012-08-02 DIAGNOSIS — S72142S Displaced intertrochanteric fracture of left femur, sequela: Secondary | ICD-10-CM

## 2012-08-02 LAB — CBC WITH DIFFERENTIAL/PLATELET
Basophils Absolute: 0.1 10*3/uL (ref 0.0–0.1)
Basophils Relative: 1 % (ref 0–1)
Eosinophils Relative: 1 % (ref 0–5)
HCT: 35.6 % — ABNORMAL LOW (ref 36.0–46.0)
Lymphocytes Relative: 13 % (ref 12–46)
MCHC: 33.4 g/dL (ref 30.0–36.0)
MCV: 89.7 fL (ref 78.0–100.0)
Monocytes Absolute: 0.6 10*3/uL (ref 0.1–1.0)
Neutro Abs: 6.5 10*3/uL (ref 1.7–7.7)
Platelets: 273 10*3/uL (ref 150–400)
RDW: 16.4 % — ABNORMAL HIGH (ref 11.5–15.5)
WBC: 8.2 10*3/uL (ref 4.0–10.5)

## 2012-08-02 LAB — COMPLETE METABOLIC PANEL WITH GFR
AST: 21 U/L (ref 0–37)
Albumin: 3.3 g/dL — ABNORMAL LOW (ref 3.5–5.2)
Alkaline Phosphatase: 94 U/L (ref 39–117)
Calcium: 8.5 mg/dL (ref 8.4–10.5)
Chloride: 98 mEq/L (ref 96–112)
Potassium: 5.1 mEq/L (ref 3.5–5.3)
Sodium: 136 mEq/L (ref 135–145)
Total Protein: 5.7 g/dL — ABNORMAL LOW (ref 6.0–8.3)

## 2012-08-02 NOTE — Progress Notes (Signed)
Subjective:    Patient ID: [name] Parsons, female    DOB: 23-Oct-1919, 79 y.o.   MRN: [id number]  HPI   79 year old female with history of rheumatoid arthritis on methotrexate and prednisone who had a prior pulmonary nodule. She fell at home in late December 2013  and scraped her right arm.   While in the emergency room in [place] a chest x-ray was obtained that showed pulmonary nodules a subsequent CT scan of the chest and PET scan were obtained.  The patient is referred for asymptomatic multiple pulmonary nodules. She underwent IR guided Biopsy and pathology showd   Lung, needle/core biopsy(ies), Left upper lobe  - DENSELY INFLAMED LUNG PARENCHYMA WITH MULTINUCLEATED GIANT CELL REACTION AND  ORGANISMS MOST CONSISTENT WITH CRYPTOCOCCUS.  Microscopic Comment   Mucicarmine, alcian blue and GMS stains highlight the presence of microorganisms.  Not have cultures sent but this but serum cryptococcal ag was positive at titer of 1:5. We started her on fluconazole at 400mg  daily. As having been seen by [place] she felt some nausea with taking the 2 200 mg tablets for consult once and instead change to 200 mg twice daily. Her immuno-suppression was lightened slightly by Dr. Dareen Piano by reduction of her methotrexate from 10 tablets weekly to 8 tablets weekly.  She has seen  Dr. Tyrone Sage with cardiothoracic surgery and he has ordered another repeat CXR from earlier today that looks very reassuring. She did suffer a fall and had hip fracture repaired with pin at [facility]. Otherwise no complaints.   Review of Systems  Constitutional: Negative for fever, chills, diaphoresis, activity change, appetite change, fatigue and unexpected weight change.  HENT: Negative for congestion, sore throat, rhinorrhea, sneezing, trouble swallowing and sinus pressure.   Eyes: Negative for photophobia and visual disturbance.  Respiratory: Negative for cough, chest tightness, shortness of breath, wheezing and stridor.    Cardiovascular: Negative for chest pain, palpitations and leg swelling.  Gastrointestinal: Negative for nausea, vomiting, abdominal pain, diarrhea, constipation, blood in stool, abdominal distention and anal bleeding.  Genitourinary: Negative for dysuria, hematuria, flank pain and difficulty urinating.  Musculoskeletal: Negative for myalgias, back pain, joint swelling, arthralgias and gait problem.  Skin: Negative for color change, pallor, rash and wound.  Neurological: Positive for headaches. Negative for dizziness, tremors, weakness and light-headedness.  Hematological: Negative for adenopathy. Does not bruise/bleed easily.  Psychiatric/Behavioral: Negative for behavioral problems, confusion, sleep disturbance, dysphoric mood, decreased concentration and agitation.       Objective:   Physical Exam  Constitutional: She is oriented to person, place, and time. No distress.  HENT:  Head: Normocephalic and atraumatic.  Mouth/Throat: Oropharynx is clear and moist. No oropharyngeal exudate.  Eyes: Conjunctivae and EOM are normal. No scleral icterus.  Neck: Normal range of motion. Neck supple.  Cardiovascular: Normal rate, regular rhythm and normal heart sounds.  Exam reveals no gallop and no friction rub.   No murmur heard. Pulmonary/Chest: Effort normal and breath sounds normal. No respiratory distress. She has no wheezes. She has no rales. She exhibits no tenderness.  Abdominal: She exhibits no distension and no mass. There is no tenderness. There is no rebound and no guarding.  Neurological: She is alert and oriented to person, place, and time. She exhibits normal muscle tone. Coordination normal.  Skin: Skin is warm and dry. She is not diaphoretic. No erythema. No pallor.  Psychiatric: She has a normal mood and affect. Her behavior is normal. Judgment and thought content normal.     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 Note there is a risk of immune reconstitution inflammatory syndrome with any tapering of immunosuppressive therapies to care should be taken to observe for symptoms of IRIS.  Ra: seeing Dr Dareen Piano  Hyperlipidemia: statin dose halved due to drug drug reaction with fluconazole.  Hip fracture; sp fixation at [facility]

## 2012-08-03 DIAGNOSIS — L8992 Pressure ulcer of unspecified site, stage 2: Secondary | ICD-10-CM | POA: Diagnosis not present

## 2012-08-03 DIAGNOSIS — R269 Unspecified abnormalities of gait and mobility: Secondary | ICD-10-CM | POA: Diagnosis not present

## 2012-08-03 DIAGNOSIS — L89109 Pressure ulcer of unspecified part of back, unspecified stage: Secondary | ICD-10-CM | POA: Diagnosis not present

## 2012-08-03 DIAGNOSIS — S72009D Fracture of unspecified part of neck of unspecified femur, subsequent encounter for closed fracture with routine healing: Secondary | ICD-10-CM | POA: Diagnosis not present

## 2012-08-07 DIAGNOSIS — L8992 Pressure ulcer of unspecified site, stage 2: Secondary | ICD-10-CM | POA: Diagnosis not present

## 2012-08-07 DIAGNOSIS — L89109 Pressure ulcer of unspecified part of back, unspecified stage: Secondary | ICD-10-CM | POA: Diagnosis not present

## 2012-08-07 DIAGNOSIS — R269 Unspecified abnormalities of gait and mobility: Secondary | ICD-10-CM | POA: Diagnosis not present

## 2012-08-07 DIAGNOSIS — S72009D Fracture of unspecified part of neck of unspecified femur, subsequent encounter for closed fracture with routine healing: Secondary | ICD-10-CM | POA: Diagnosis not present

## 2012-08-09 DIAGNOSIS — L8992 Pressure ulcer of unspecified site, stage 2: Secondary | ICD-10-CM | POA: Diagnosis not present

## 2012-08-09 DIAGNOSIS — L89109 Pressure ulcer of unspecified part of back, unspecified stage: Secondary | ICD-10-CM | POA: Diagnosis not present

## 2012-08-09 DIAGNOSIS — R269 Unspecified abnormalities of gait and mobility: Secondary | ICD-10-CM | POA: Diagnosis not present

## 2012-08-09 DIAGNOSIS — S72009D Fracture of unspecified part of neck of unspecified femur, subsequent encounter for closed fracture with routine healing: Secondary | ICD-10-CM | POA: Diagnosis not present

## 2012-08-14 DIAGNOSIS — R269 Unspecified abnormalities of gait and mobility: Secondary | ICD-10-CM | POA: Diagnosis not present

## 2012-08-14 DIAGNOSIS — L89109 Pressure ulcer of unspecified part of back, unspecified stage: Secondary | ICD-10-CM | POA: Diagnosis not present

## 2012-08-14 DIAGNOSIS — S72009D Fracture of unspecified part of neck of unspecified femur, subsequent encounter for closed fracture with routine healing: Secondary | ICD-10-CM | POA: Diagnosis not present

## 2012-08-14 DIAGNOSIS — L8992 Pressure ulcer of unspecified site, stage 2: Secondary | ICD-10-CM | POA: Diagnosis not present

## 2012-08-22 DIAGNOSIS — L89109 Pressure ulcer of unspecified part of back, unspecified stage: Secondary | ICD-10-CM | POA: Diagnosis not present

## 2012-08-22 DIAGNOSIS — R269 Unspecified abnormalities of gait and mobility: Secondary | ICD-10-CM | POA: Diagnosis not present

## 2012-08-22 DIAGNOSIS — S72009D Fracture of unspecified part of neck of unspecified femur, subsequent encounter for closed fracture with routine healing: Secondary | ICD-10-CM | POA: Diagnosis not present

## 2012-08-22 DIAGNOSIS — L8992 Pressure ulcer of unspecified site, stage 2: Secondary | ICD-10-CM | POA: Diagnosis not present

## 2012-08-25 DIAGNOSIS — M069 Rheumatoid arthritis, unspecified: Secondary | ICD-10-CM | POA: Diagnosis not present

## 2012-08-25 DIAGNOSIS — M810 Age-related osteoporosis without current pathological fracture: Secondary | ICD-10-CM | POA: Diagnosis not present

## 2012-08-28 DIAGNOSIS — L8992 Pressure ulcer of unspecified site, stage 2: Secondary | ICD-10-CM | POA: Diagnosis not present

## 2012-08-28 DIAGNOSIS — M069 Rheumatoid arthritis, unspecified: Secondary | ICD-10-CM | POA: Diagnosis not present

## 2012-08-28 DIAGNOSIS — D509 Iron deficiency anemia, unspecified: Secondary | ICD-10-CM | POA: Diagnosis not present

## 2012-08-28 DIAGNOSIS — E785 Hyperlipidemia, unspecified: Secondary | ICD-10-CM | POA: Diagnosis not present

## 2012-08-28 DIAGNOSIS — R269 Unspecified abnormalities of gait and mobility: Secondary | ICD-10-CM | POA: Diagnosis not present

## 2012-08-28 DIAGNOSIS — Z681 Body mass index (BMI) 19 or less, adult: Secondary | ICD-10-CM | POA: Diagnosis not present

## 2012-08-28 DIAGNOSIS — S72009D Fracture of unspecified part of neck of unspecified femur, subsequent encounter for closed fracture with routine healing: Secondary | ICD-10-CM | POA: Diagnosis not present

## 2012-08-28 DIAGNOSIS — L89109 Pressure ulcer of unspecified part of back, unspecified stage: Secondary | ICD-10-CM | POA: Diagnosis not present

## 2012-09-07 DIAGNOSIS — S72009D Fracture of unspecified part of neck of unspecified femur, subsequent encounter for closed fracture with routine healing: Secondary | ICD-10-CM | POA: Diagnosis not present

## 2012-09-07 DIAGNOSIS — L8992 Pressure ulcer of unspecified site, stage 2: Secondary | ICD-10-CM | POA: Diagnosis not present

## 2012-09-07 DIAGNOSIS — R269 Unspecified abnormalities of gait and mobility: Secondary | ICD-10-CM | POA: Diagnosis not present

## 2012-09-07 DIAGNOSIS — L89109 Pressure ulcer of unspecified part of back, unspecified stage: Secondary | ICD-10-CM | POA: Diagnosis not present

## 2012-09-12 DIAGNOSIS — L89109 Pressure ulcer of unspecified part of back, unspecified stage: Secondary | ICD-10-CM | POA: Diagnosis not present

## 2012-09-12 DIAGNOSIS — L8992 Pressure ulcer of unspecified site, stage 2: Secondary | ICD-10-CM | POA: Diagnosis not present

## 2012-09-12 DIAGNOSIS — R269 Unspecified abnormalities of gait and mobility: Secondary | ICD-10-CM | POA: Diagnosis not present

## 2012-09-13 DIAGNOSIS — L89109 Pressure ulcer of unspecified part of back, unspecified stage: Secondary | ICD-10-CM | POA: Diagnosis not present

## 2012-09-13 DIAGNOSIS — L8992 Pressure ulcer of unspecified site, stage 2: Secondary | ICD-10-CM | POA: Diagnosis not present

## 2012-09-13 DIAGNOSIS — R269 Unspecified abnormalities of gait and mobility: Secondary | ICD-10-CM | POA: Diagnosis not present

## 2012-09-14 DIAGNOSIS — L89109 Pressure ulcer of unspecified part of back, unspecified stage: Secondary | ICD-10-CM | POA: Diagnosis not present

## 2012-09-14 DIAGNOSIS — R269 Unspecified abnormalities of gait and mobility: Secondary | ICD-10-CM | POA: Diagnosis not present

## 2012-09-14 DIAGNOSIS — L8992 Pressure ulcer of unspecified site, stage 2: Secondary | ICD-10-CM | POA: Diagnosis not present

## 2012-09-15 ENCOUNTER — Encounter: Payer: Self-pay | Admitting: Internal Medicine

## 2012-09-15 ENCOUNTER — Encounter: Payer: Self-pay | Admitting: Nephrology

## 2012-09-18 DIAGNOSIS — R269 Unspecified abnormalities of gait and mobility: Secondary | ICD-10-CM | POA: Diagnosis not present

## 2012-09-18 DIAGNOSIS — L89109 Pressure ulcer of unspecified part of back, unspecified stage: Secondary | ICD-10-CM | POA: Diagnosis not present

## 2012-09-18 DIAGNOSIS — L8992 Pressure ulcer of unspecified site, stage 2: Secondary | ICD-10-CM | POA: Diagnosis not present

## 2012-09-21 DIAGNOSIS — K219 Gastro-esophageal reflux disease without esophagitis: Secondary | ICD-10-CM | POA: Diagnosis not present

## 2012-09-21 DIAGNOSIS — S0100XA Unspecified open wound of scalp, initial encounter: Secondary | ICD-10-CM | POA: Diagnosis not present

## 2012-09-21 DIAGNOSIS — E7800 Pure hypercholesterolemia, unspecified: Secondary | ICD-10-CM | POA: Diagnosis not present

## 2012-09-21 DIAGNOSIS — Z79899 Other long term (current) drug therapy: Secondary | ICD-10-CM | POA: Diagnosis not present

## 2012-09-21 DIAGNOSIS — D649 Anemia, unspecified: Secondary | ICD-10-CM | POA: Diagnosis not present

## 2012-09-21 DIAGNOSIS — W010XXA Fall on same level from slipping, tripping and stumbling without subsequent striking against object, initial encounter: Secondary | ICD-10-CM | POA: Diagnosis not present

## 2012-09-21 DIAGNOSIS — I10 Essential (primary) hypertension: Secondary | ICD-10-CM | POA: Diagnosis not present

## 2012-09-21 DIAGNOSIS — I509 Heart failure, unspecified: Secondary | ICD-10-CM | POA: Diagnosis not present

## 2012-09-21 DIAGNOSIS — S61209A Unspecified open wound of unspecified finger without damage to nail, initial encounter: Secondary | ICD-10-CM | POA: Diagnosis not present

## 2012-09-25 DIAGNOSIS — L89109 Pressure ulcer of unspecified part of back, unspecified stage: Secondary | ICD-10-CM | POA: Diagnosis not present

## 2012-09-25 DIAGNOSIS — R269 Unspecified abnormalities of gait and mobility: Secondary | ICD-10-CM | POA: Diagnosis not present

## 2012-09-25 DIAGNOSIS — L8992 Pressure ulcer of unspecified site, stage 2: Secondary | ICD-10-CM | POA: Diagnosis not present

## 2012-09-28 DIAGNOSIS — S0100XA Unspecified open wound of scalp, initial encounter: Secondary | ICD-10-CM | POA: Diagnosis not present

## 2012-09-28 DIAGNOSIS — Z681 Body mass index (BMI) 19 or less, adult: Secondary | ICD-10-CM | POA: Diagnosis not present

## 2012-09-28 DIAGNOSIS — S72143A Displaced intertrochanteric fracture of unspecified femur, initial encounter for closed fracture: Secondary | ICD-10-CM | POA: Diagnosis not present

## 2012-10-05 DIAGNOSIS — L8992 Pressure ulcer of unspecified site, stage 2: Secondary | ICD-10-CM | POA: Diagnosis not present

## 2012-10-05 DIAGNOSIS — L89109 Pressure ulcer of unspecified part of back, unspecified stage: Secondary | ICD-10-CM | POA: Diagnosis not present

## 2012-10-05 DIAGNOSIS — R269 Unspecified abnormalities of gait and mobility: Secondary | ICD-10-CM | POA: Diagnosis not present

## 2012-10-09 DIAGNOSIS — R269 Unspecified abnormalities of gait and mobility: Secondary | ICD-10-CM | POA: Diagnosis not present

## 2012-10-09 DIAGNOSIS — L89109 Pressure ulcer of unspecified part of back, unspecified stage: Secondary | ICD-10-CM | POA: Diagnosis not present

## 2012-10-09 DIAGNOSIS — L8992 Pressure ulcer of unspecified site, stage 2: Secondary | ICD-10-CM | POA: Diagnosis not present

## 2012-10-16 DIAGNOSIS — L8992 Pressure ulcer of unspecified site, stage 2: Secondary | ICD-10-CM | POA: Diagnosis not present

## 2012-10-16 DIAGNOSIS — R269 Unspecified abnormalities of gait and mobility: Secondary | ICD-10-CM | POA: Diagnosis not present

## 2012-10-16 DIAGNOSIS — L89109 Pressure ulcer of unspecified part of back, unspecified stage: Secondary | ICD-10-CM | POA: Diagnosis not present

## 2012-10-22 DIAGNOSIS — S51809A Unspecified open wound of unspecified forearm, initial encounter: Secondary | ICD-10-CM | POA: Diagnosis not present

## 2012-10-27 DIAGNOSIS — S72143A Displaced intertrochanteric fracture of unspecified femur, initial encounter for closed fracture: Secondary | ICD-10-CM | POA: Diagnosis not present

## 2012-11-08 DIAGNOSIS — S72019B Unspecified intracapsular fracture of unspecified femur, initial encounter for open fracture type I or II: Secondary | ICD-10-CM | POA: Diagnosis not present

## 2012-11-08 DIAGNOSIS — M47817 Spondylosis without myelopathy or radiculopathy, lumbosacral region: Secondary | ICD-10-CM | POA: Diagnosis not present

## 2012-11-08 DIAGNOSIS — M069 Rheumatoid arthritis, unspecified: Secondary | ICD-10-CM | POA: Diagnosis not present

## 2012-11-09 DIAGNOSIS — B459 Cryptococcosis, unspecified: Secondary | ICD-10-CM | POA: Diagnosis not present

## 2012-11-09 DIAGNOSIS — I5030 Unspecified diastolic (congestive) heart failure: Secondary | ICD-10-CM | POA: Diagnosis not present

## 2012-11-09 DIAGNOSIS — I110 Hypertensive heart disease with heart failure: Secondary | ICD-10-CM | POA: Diagnosis not present

## 2012-11-09 DIAGNOSIS — I509 Heart failure, unspecified: Secondary | ICD-10-CM | POA: Diagnosis not present

## 2012-11-09 DIAGNOSIS — I951 Orthostatic hypotension: Secondary | ICD-10-CM | POA: Diagnosis not present

## 2012-11-11 DIAGNOSIS — K5641 Fecal impaction: Secondary | ICD-10-CM | POA: Diagnosis not present

## 2012-11-11 DIAGNOSIS — R1084 Generalized abdominal pain: Secondary | ICD-10-CM | POA: Diagnosis not present

## 2012-11-11 DIAGNOSIS — K219 Gastro-esophageal reflux disease without esophagitis: Secondary | ICD-10-CM | POA: Diagnosis not present

## 2012-11-11 DIAGNOSIS — R109 Unspecified abdominal pain: Secondary | ICD-10-CM | POA: Diagnosis not present

## 2012-11-11 DIAGNOSIS — E7800 Pure hypercholesterolemia, unspecified: Secondary | ICD-10-CM | POA: Diagnosis not present

## 2012-11-11 DIAGNOSIS — D649 Anemia, unspecified: Secondary | ICD-10-CM | POA: Diagnosis not present

## 2012-11-11 DIAGNOSIS — I509 Heart failure, unspecified: Secondary | ICD-10-CM | POA: Diagnosis not present

## 2012-11-11 DIAGNOSIS — Z79899 Other long term (current) drug therapy: Secondary | ICD-10-CM | POA: Diagnosis not present

## 2012-11-11 DIAGNOSIS — K5900 Constipation, unspecified: Secondary | ICD-10-CM | POA: Diagnosis not present

## 2012-11-12 DIAGNOSIS — K219 Gastro-esophageal reflux disease without esophagitis: Secondary | ICD-10-CM | POA: Diagnosis not present

## 2012-11-12 DIAGNOSIS — K5641 Fecal impaction: Secondary | ICD-10-CM | POA: Diagnosis not present

## 2012-11-12 DIAGNOSIS — R109 Unspecified abdominal pain: Secondary | ICD-10-CM | POA: Diagnosis not present

## 2012-11-12 DIAGNOSIS — E7800 Pure hypercholesterolemia, unspecified: Secondary | ICD-10-CM | POA: Diagnosis not present

## 2012-11-12 DIAGNOSIS — I509 Heart failure, unspecified: Secondary | ICD-10-CM | POA: Diagnosis not present

## 2012-11-12 DIAGNOSIS — Z79899 Other long term (current) drug therapy: Secondary | ICD-10-CM | POA: Diagnosis not present

## 2012-11-12 DIAGNOSIS — D649 Anemia, unspecified: Secondary | ICD-10-CM | POA: Diagnosis not present

## 2012-11-15 DIAGNOSIS — K5900 Constipation, unspecified: Secondary | ICD-10-CM | POA: Diagnosis not present

## 2012-11-15 DIAGNOSIS — R109 Unspecified abdominal pain: Secondary | ICD-10-CM | POA: Diagnosis not present

## 2012-11-15 DIAGNOSIS — K5641 Fecal impaction: Secondary | ICD-10-CM | POA: Diagnosis not present

## 2012-11-15 DIAGNOSIS — Z681 Body mass index (BMI) 19 or less, adult: Secondary | ICD-10-CM | POA: Diagnosis not present

## 2012-11-17 DIAGNOSIS — S72019B Unspecified intracapsular fracture of unspecified femur, initial encounter for open fracture type I or II: Secondary | ICD-10-CM | POA: Diagnosis not present

## 2012-11-17 DIAGNOSIS — M47817 Spondylosis without myelopathy or radiculopathy, lumbosacral region: Secondary | ICD-10-CM | POA: Diagnosis not present

## 2012-11-17 DIAGNOSIS — M069 Rheumatoid arthritis, unspecified: Secondary | ICD-10-CM | POA: Diagnosis not present

## 2012-11-20 DIAGNOSIS — M069 Rheumatoid arthritis, unspecified: Secondary | ICD-10-CM | POA: Diagnosis not present

## 2012-11-20 DIAGNOSIS — M47817 Spondylosis without myelopathy or radiculopathy, lumbosacral region: Secondary | ICD-10-CM | POA: Diagnosis not present

## 2012-11-20 DIAGNOSIS — S72019B Unspecified intracapsular fracture of unspecified femur, initial encounter for open fracture type I or II: Secondary | ICD-10-CM | POA: Diagnosis not present

## 2012-11-21 DIAGNOSIS — S51809A Unspecified open wound of unspecified forearm, initial encounter: Secondary | ICD-10-CM | POA: Diagnosis not present

## 2012-11-30 DIAGNOSIS — M069 Rheumatoid arthritis, unspecified: Secondary | ICD-10-CM | POA: Diagnosis not present

## 2012-11-30 DIAGNOSIS — I5030 Unspecified diastolic (congestive) heart failure: Secondary | ICD-10-CM | POA: Diagnosis not present

## 2012-11-30 DIAGNOSIS — Z681 Body mass index (BMI) 19 or less, adult: Secondary | ICD-10-CM | POA: Diagnosis not present

## 2012-11-30 DIAGNOSIS — E785 Hyperlipidemia, unspecified: Secondary | ICD-10-CM | POA: Diagnosis not present

## 2012-11-30 DIAGNOSIS — D509 Iron deficiency anemia, unspecified: Secondary | ICD-10-CM | POA: Diagnosis not present

## 2012-12-04 ENCOUNTER — Ambulatory Visit (INDEPENDENT_AMBULATORY_CARE_PROVIDER_SITE_OTHER): Payer: Medicare Other | Admitting: Infectious Disease

## 2012-12-04 ENCOUNTER — Encounter: Payer: Self-pay | Admitting: Infectious Disease

## 2012-12-04 VITALS — BP 105/65 | HR 77 | Temp 97.7°F | Wt 94.0 lb

## 2012-12-04 DIAGNOSIS — E785 Hyperlipidemia, unspecified: Secondary | ICD-10-CM | POA: Diagnosis not present

## 2012-12-04 DIAGNOSIS — Z23 Encounter for immunization: Secondary | ICD-10-CM

## 2012-12-04 DIAGNOSIS — M069 Rheumatoid arthritis, unspecified: Secondary | ICD-10-CM

## 2012-12-04 DIAGNOSIS — B459 Cryptococcosis, unspecified: Secondary | ICD-10-CM | POA: Diagnosis not present

## 2012-12-04 DIAGNOSIS — L659 Nonscarring hair loss, unspecified: Secondary | ICD-10-CM | POA: Diagnosis not present

## 2012-12-04 NOTE — Progress Notes (Signed)
Subjective:    Patient ID: [name] Parsons, female    DOB: [birth year], 79 y.o.   MRN: [id number]  HPI   79 year old female with history of rheumatoid arthritis on methotrexate and prednisone who had a prior pulmonary nodule. She fell at home in late December 2013  and scraped her right arm.   While in the emergency room in [place] a chest x-ray was obtained that showed pulmonary nodules a subsequent CT scan of the chest and PET scan were obtained.  The patient is referred for asymptomatic multiple pulmonary nodules. She underwent IR guided Biopsy and pathology showd   Lung, needle/core biopsy(ies), Left upper lobe  - DENSELY INFLAMED LUNG PARENCHYMA WITH MULTINUCLEATED GIANT CELL REACTION AND  ORGANISMS MOST CONSISTENT WITH CRYPTOCOCCUS.  Microscopic Comment   Mucicarmine, alcian blue and GMS stains highlight the presence of microorganisms.  Not have cultures sent but this but serum cryptococcal ag was positive at titer of 1:5. We started her on fluconazole at 400mg  daily. As having been seen by [place] she felt some nausea with taking the 2 200 mg tablets for consult once and instead change to 200 mg twice daily. Her immuno-suppression was lightened slightly by Dr. Dareen Piano by reduction of her methotrexate from 10 tablets weekly to 8 tablets weekly.  Repeat chest x-ray was reassuring. She is continued on 400 mg total of fluconazole daily and is nearing one year of therapy which will be completed at the end of January early February. I asked about side effects and she has had no side effects that she know of her liver function tests have been stable. She has had thinning hair but this preceded the use of fluconazole.   Review of Systems  Constitutional: Negative for fever, chills, diaphoresis, activity change, appetite change, fatigue and unexpected weight change.  HENT: Negative for congestion, sore throat, rhinorrhea, sneezing, trouble swallowing and sinus pressure.   Eyes: Negative for  photophobia and visual disturbance.  Respiratory: Negative for cough, chest tightness, shortness of breath, wheezing and stridor.   Cardiovascular: Negative for chest pain, palpitations and leg swelling.  Gastrointestinal: Negative for nausea, vomiting, abdominal pain, diarrhea, constipation, blood in stool, abdominal distention and anal bleeding.  Genitourinary: Negative for dysuria, hematuria, flank pain and difficulty urinating.  Musculoskeletal: Negative for myalgias, back pain, joint swelling, arthralgias and gait problem.  Skin: Negative for color change, pallor, rash and wound.  Neurological: Negative for dizziness, tremors, weakness, light-headedness and headaches.  Hematological: Negative for adenopathy. Does not bruise/bleed easily.  Psychiatric/Behavioral: Negative for behavioral problems, confusion, sleep disturbance, dysphoric mood, decreased concentration and agitation.       Objective:   Physical Exam  Constitutional: She is oriented to person, place, and time. No distress.  HENT:  Head: Normocephalic and atraumatic.  Mouth/Throat: Oropharynx is clear and moist. No oropharyngeal exudate.  Eyes: Conjunctivae and EOM are normal. No scleral icterus.  Neck: Normal range of motion. Neck supple.  Cardiovascular: Normal rate, regular rhythm and normal heart sounds.  Exam reveals no gallop and no friction rub.   No murmur heard. Pulmonary/Chest: Effort normal and breath sounds normal. No respiratory distress. She has no wheezes. She has no rales. She exhibits no tenderness.  Abdominal: She exhibits no distension and no mass. There is no tenderness. There is no rebound and no guarding.  Neurological: She is alert and oriented to person, place, and time. She exhibits normal muscle tone. Coordination normal.  Skin: Skin is warm and dry. She is not diaphoretic. No erythema.  No pallor.  Psychiatric: She has a normal mood and affect. Her behavior is normal. Judgment and thought content  normal.     Walks with walker     Assessment & Plan:  Cryptococcosis: Continue fluconazole 400 mg  Thru Late January early February  Ra: seeing Dr Dareen Piano  Hyperlipidemia: statin dose was increased by PCP again, monitor for toxicity given coadministration of azole  Hair thinning: Received use of fluconazole is fluconazole is exacerbating this this should improve with discontinuation of fluconazole in early February.

## 2012-12-14 DIAGNOSIS — M069 Rheumatoid arthritis, unspecified: Secondary | ICD-10-CM | POA: Diagnosis not present

## 2012-12-14 DIAGNOSIS — M47817 Spondylosis without myelopathy or radiculopathy, lumbosacral region: Secondary | ICD-10-CM | POA: Diagnosis not present

## 2012-12-14 DIAGNOSIS — S72019B Unspecified intracapsular fracture of unspecified femur, initial encounter for open fracture type I or II: Secondary | ICD-10-CM | POA: Diagnosis not present

## 2012-12-22 DIAGNOSIS — M810 Age-related osteoporosis without current pathological fracture: Secondary | ICD-10-CM | POA: Diagnosis not present

## 2012-12-22 DIAGNOSIS — M069 Rheumatoid arthritis, unspecified: Secondary | ICD-10-CM | POA: Diagnosis not present

## 2012-12-22 DIAGNOSIS — M159 Polyosteoarthritis, unspecified: Secondary | ICD-10-CM | POA: Diagnosis not present

## 2012-12-22 DIAGNOSIS — M25519 Pain in unspecified shoulder: Secondary | ICD-10-CM | POA: Diagnosis not present

## 2012-12-27 DIAGNOSIS — S72019B Unspecified intracapsular fracture of unspecified femur, initial encounter for open fracture type I or II: Secondary | ICD-10-CM | POA: Diagnosis not present

## 2012-12-27 DIAGNOSIS — M47817 Spondylosis without myelopathy or radiculopathy, lumbosacral region: Secondary | ICD-10-CM | POA: Diagnosis not present

## 2012-12-27 DIAGNOSIS — M069 Rheumatoid arthritis, unspecified: Secondary | ICD-10-CM | POA: Diagnosis not present

## 2013-01-30 DIAGNOSIS — Z681 Body mass index (BMI) 19 or less, adult: Secondary | ICD-10-CM | POA: Diagnosis not present

## 2013-01-30 DIAGNOSIS — M47817 Spondylosis without myelopathy or radiculopathy, lumbosacral region: Secondary | ICD-10-CM | POA: Diagnosis not present

## 2013-01-30 DIAGNOSIS — S32009A Unspecified fracture of unspecified lumbar vertebra, initial encounter for closed fracture: Secondary | ICD-10-CM | POA: Diagnosis not present

## 2013-01-30 DIAGNOSIS — IMO0002 Reserved for concepts with insufficient information to code with codable children: Secondary | ICD-10-CM | POA: Diagnosis not present

## 2013-02-14 DIAGNOSIS — H1030 Unspecified acute conjunctivitis, unspecified eye: Secondary | ICD-10-CM | POA: Diagnosis not present

## 2013-02-14 DIAGNOSIS — Z961 Presence of intraocular lens: Secondary | ICD-10-CM | POA: Diagnosis not present

## 2013-02-14 DIAGNOSIS — H01009 Unspecified blepharitis unspecified eye, unspecified eyelid: Secondary | ICD-10-CM | POA: Diagnosis not present

## 2013-03-12 DIAGNOSIS — E785 Hyperlipidemia, unspecified: Secondary | ICD-10-CM | POA: Diagnosis not present

## 2013-03-12 DIAGNOSIS — D509 Iron deficiency anemia, unspecified: Secondary | ICD-10-CM | POA: Diagnosis not present

## 2013-03-12 DIAGNOSIS — I5030 Unspecified diastolic (congestive) heart failure: Secondary | ICD-10-CM | POA: Diagnosis not present

## 2013-03-12 DIAGNOSIS — M069 Rheumatoid arthritis, unspecified: Secondary | ICD-10-CM | POA: Diagnosis not present

## 2013-04-02 ENCOUNTER — Ambulatory Visit: Payer: Medicare Other | Admitting: Infectious Disease

## 2013-04-07 DIAGNOSIS — M25519 Pain in unspecified shoulder: Secondary | ICD-10-CM | POA: Diagnosis not present

## 2013-04-09 ENCOUNTER — Ambulatory Visit: Payer: Medicare Other | Admitting: Infectious Disease

## 2013-04-19 ENCOUNTER — Encounter: Payer: Self-pay | Admitting: Infectious Disease

## 2013-04-19 ENCOUNTER — Ambulatory Visit: Payer: Medicare Other | Admitting: Infectious Disease

## 2013-04-19 ENCOUNTER — Ambulatory Visit (INDEPENDENT_AMBULATORY_CARE_PROVIDER_SITE_OTHER): Payer: Medicare Other | Admitting: Infectious Disease

## 2013-04-19 VITALS — BP 93/62 | HR 71 | Temp 98.0°F | Ht 60.0 in | Wt 101.0 lb

## 2013-04-19 DIAGNOSIS — B450 Pulmonary cryptococcosis: Secondary | ICD-10-CM

## 2013-04-19 DIAGNOSIS — B459 Cryptococcosis, unspecified: Secondary | ICD-10-CM

## 2013-04-19 DIAGNOSIS — L659 Nonscarring hair loss, unspecified: Secondary | ICD-10-CM | POA: Diagnosis not present

## 2013-04-19 NOTE — Progress Notes (Signed)
Subjective:    Patient ID: [name] Parsons, female    DOB: May 19, 1919, 81 y.o.   MRN: [id number]  HPI   80 year old female with history of rheumatoid arthritis on methotrexate and prednisone who had a prior pulmonary nodule. She fell at home in late December 2013  and scraped her right arm.   While in the emergency room in [place] a chest x-ray was obtained that showed pulmonary nodules a subsequent CT scan of the chest and PET scan were obtained.  The patient is referred for asymptomatic multiple pulmonary nodules. She underwent IR guided Biopsy and pathology showd   Lung, needle/core biopsy(ies), Left upper lobe  - DENSELY INFLAMED LUNG PARENCHYMA WITH MULTINUCLEATED GIANT CELL REACTION AND  ORGANISMS MOST CONSISTENT WITH CRYPTOCOCCUS.  Microscopic Comment   Mucicarmine, alcian blue and GMS stains highlight the presence of microorganisms.  Not have cultures sent but this but serum cryptococcal ag was positive at titer of 1:5. We started her on fluconazole at 400mg  daily. As having been seen by [place] she felt some nausea with taking the 2 200 mg tablets for consult once and instead change to 200 mg twice daily. Her immuno-suppression was lightened slightly by Dr. Ouida Sills by reduction of her methotrexate from 10 tablets weekly to 8 tablets weekly.  Repeat chest x-ray was reassuring. She is continued on 400 mg total of fluconazole daily and is nearing one year of therapy which will be completed at the end of January early February. I asked about side effects and she has had no side effects that she know of her liver function tests have been stable. She has had thinning hair but this preceded the use of fluconazole.   Review of Systems  Constitutional: Negative for fever, chills, diaphoresis, activity change, appetite change, fatigue and unexpected weight change.  HENT: Negative for congestion, rhinorrhea, sinus pressure, sneezing, sore throat and trouble swallowing.   Eyes: Negative for  photophobia and visual disturbance.  Respiratory: Negative for cough, chest tightness, shortness of breath, wheezing and stridor.   Cardiovascular: Negative for chest pain, palpitations and leg swelling.  Gastrointestinal: Negative for nausea, vomiting, abdominal pain, diarrhea, constipation, blood in stool, abdominal distention and anal bleeding.  Genitourinary: Negative for dysuria, hematuria, flank pain and difficulty urinating.  Musculoskeletal: Negative for arthralgias, back pain, gait problem, joint swelling and myalgias.  Skin: Negative for color change, pallor, rash and wound.  Neurological: Negative for dizziness, tremors, weakness, light-headedness and headaches.  Hematological: Negative for adenopathy. Does not bruise/bleed easily.  Psychiatric/Behavioral: Negative for behavioral problems, confusion, sleep disturbance, dysphoric mood, decreased concentration and agitation.       Objective:   Physical Exam  Constitutional: She is oriented to person, place, and time. No distress.  HENT:  Head: Normocephalic and atraumatic.  Mouth/Throat: Oropharynx is clear and moist. No oropharyngeal exudate.  Eyes: Conjunctivae and EOM are normal. No scleral icterus.  Neck: Normal range of motion. Neck supple.  Cardiovascular: Normal rate, regular rhythm and normal heart sounds.  Exam reveals no gallop and no friction rub.   No murmur heard. Pulmonary/Chest: Effort normal and breath sounds normal. No respiratory distress. She has no wheezes. She has no rales. She exhibits no tenderness.  Abdominal: She exhibits no distension and no mass. There is no tenderness. There is no rebound and no guarding.  Neurological: She is alert and oriented to person, place, and time. She exhibits normal muscle tone. Coordination normal.  Skin: Skin is warm and dry. She is not diaphoretic. No erythema.  No pallor.  Psychiatric: She has a normal mood and affect. Her behavior is normal. Judgment and thought content  normal.          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 Stop fluconazole and have her come back to clinic in approximately 3-4 months time for reevaluation clinically. If she has any pulmonary symptoms fevers or chills we will reimage her chest with a CT scan.   Ra: seeing Dr Ouida Sills  Hair thinning: Very likely related to use of fluconazoleshould improve with discontinuation of fluconazole in early February.

## 2013-06-07 DIAGNOSIS — M25519 Pain in unspecified shoulder: Secondary | ICD-10-CM | POA: Diagnosis not present

## 2013-06-07 DIAGNOSIS — M069 Rheumatoid arthritis, unspecified: Secondary | ICD-10-CM | POA: Diagnosis not present

## 2013-06-07 DIAGNOSIS — M810 Age-related osteoporosis without current pathological fracture: Secondary | ICD-10-CM | POA: Diagnosis not present

## 2013-06-07 DIAGNOSIS — M159 Polyosteoarthritis, unspecified: Secondary | ICD-10-CM | POA: Diagnosis not present

## 2013-06-14 DIAGNOSIS — R300 Dysuria: Secondary | ICD-10-CM | POA: Diagnosis not present

## 2013-06-14 DIAGNOSIS — I5030 Unspecified diastolic (congestive) heart failure: Secondary | ICD-10-CM | POA: Diagnosis not present

## 2013-06-14 DIAGNOSIS — D509 Iron deficiency anemia, unspecified: Secondary | ICD-10-CM | POA: Diagnosis not present

## 2013-06-14 DIAGNOSIS — E785 Hyperlipidemia, unspecified: Secondary | ICD-10-CM | POA: Diagnosis not present

## 2013-06-14 DIAGNOSIS — IMO0002 Reserved for concepts with insufficient information to code with codable children: Secondary | ICD-10-CM | POA: Diagnosis not present

## 2013-06-14 DIAGNOSIS — M069 Rheumatoid arthritis, unspecified: Secondary | ICD-10-CM | POA: Diagnosis not present

## 2013-06-20 DIAGNOSIS — I509 Heart failure, unspecified: Secondary | ICD-10-CM | POA: Diagnosis not present

## 2013-06-20 DIAGNOSIS — I5030 Unspecified diastolic (congestive) heart failure: Secondary | ICD-10-CM | POA: Diagnosis not present

## 2013-06-20 DIAGNOSIS — E785 Hyperlipidemia, unspecified: Secondary | ICD-10-CM | POA: Diagnosis not present

## 2013-06-20 DIAGNOSIS — I110 Hypertensive heart disease with heart failure: Secondary | ICD-10-CM | POA: Diagnosis not present

## 2013-06-27 DIAGNOSIS — S72143A Displaced intertrochanteric fracture of unspecified femur, initial encounter for closed fracture: Secondary | ICD-10-CM | POA: Diagnosis not present

## 2013-07-11 DIAGNOSIS — M159 Polyosteoarthritis, unspecified: Secondary | ICD-10-CM | POA: Diagnosis not present

## 2013-07-11 DIAGNOSIS — M810 Age-related osteoporosis without current pathological fracture: Secondary | ICD-10-CM | POA: Diagnosis not present

## 2013-07-11 DIAGNOSIS — M25519 Pain in unspecified shoulder: Secondary | ICD-10-CM | POA: Diagnosis not present

## 2013-07-11 DIAGNOSIS — M069 Rheumatoid arthritis, unspecified: Secondary | ICD-10-CM | POA: Diagnosis not present

## 2013-08-20 ENCOUNTER — Encounter: Payer: Self-pay | Admitting: Infectious Disease

## 2013-08-20 ENCOUNTER — Ambulatory Visit (INDEPENDENT_AMBULATORY_CARE_PROVIDER_SITE_OTHER): Payer: Medicare Other | Admitting: Infectious Disease

## 2013-08-20 VITALS — BP 143/87 | HR 66 | Temp 97.5°F | Wt 115.0 lb

## 2013-08-20 DIAGNOSIS — R911 Solitary pulmonary nodule: Secondary | ICD-10-CM | POA: Diagnosis not present

## 2013-08-20 DIAGNOSIS — B459 Cryptococcosis, unspecified: Secondary | ICD-10-CM

## 2013-08-20 DIAGNOSIS — M069 Rheumatoid arthritis, unspecified: Secondary | ICD-10-CM

## 2013-08-20 NOTE — Progress Notes (Signed)
Subjective:    Patient ID: [name] Parsons, female    DOB: 03/12/1919, 81 y.o.   MRN: [id number]  HPI   80 year old female with history of rheumatoid arthritis on methotrexate and prednisone who had a prior pulmonary nodule. She fell at home in late December 2013  and scraped her right arm.   While in the emergency room in [place] a chest x-ray was obtained that showed pulmonary nodules a subsequent CT scan of the chest and PET scan were obtained.  The patient is referred for asymptomatic multiple pulmonary nodules. She underwent IR guided Biopsy and pathology showd   Lung, needle/core biopsy(ies), Left upper lobe  - DENSELY INFLAMED LUNG PARENCHYMA WITH MULTINUCLEATED GIANT CELL REACTION AND  ORGANISMS MOST CONSISTENT WITH CRYPTOCOCCUS.  Microscopic Comment   Mucicarmine, alcian blue and GMS stains highlight the presence of microorganisms.  Not have cultures sent but this but serum cryptococcal ag was positive at titer of 1:5. We started her on fluconazole at 400mg  daily. As having been seen by [place] she felt some nausea with taking the 2 200 mg tablets for consult once and instead change to 200 mg twice daily. Her immuno-suppression was lightened slightly by Dr. Ouida Sills by reduction of her methotrexate from 10 tablets weekly to 8 tablets weekly.  Repeat chest x-ray was reassuring. She was  continued on 400 mg total of fluconazole daily completed one year of therapy   Chest x-ray done in May showed improvement of her nodules. She's been off antibiotics for several months and has had no cough no dyspnea no fevers no chills no weight loss and in fact has gained weight  Review of Systems  Constitutional: Negative for fever, chills, diaphoresis, activity change, appetite change, fatigue and unexpected weight change.  HENT: Negative for congestion, rhinorrhea, sinus pressure, sneezing, sore throat and trouble swallowing.   Eyes: Negative for photophobia and visual disturbance.  Respiratory:  Negative for cough, chest tightness, shortness of breath, wheezing and stridor.   Cardiovascular: Negative for chest pain, palpitations and leg swelling.  Gastrointestinal: Negative for nausea, vomiting, abdominal pain, diarrhea, constipation, blood in stool, abdominal distention and anal bleeding.  Genitourinary: Negative for dysuria, hematuria, flank pain and difficulty urinating.  Musculoskeletal: Negative for arthralgias, back pain, gait problem, joint swelling and myalgias.  Skin: Negative for color change, pallor, rash and wound.  Neurological: Negative for dizziness, tremors, weakness, light-headedness and headaches.  Hematological: Negative for adenopathy. Does not bruise/bleed easily.  Psychiatric/Behavioral: Negative for behavioral problems, confusion, sleep disturbance, dysphoric mood, decreased concentration and agitation.       Objective:   Physical Exam  Constitutional: She is oriented to person, place, and time. No distress.  HENT:  Head: Normocephalic and atraumatic.  Mouth/Throat: Oropharynx is clear and moist. No oropharyngeal exudate.  Eyes: Conjunctivae and EOM are normal. No scleral icterus.  Neck: Normal range of motion. Neck supple.  Cardiovascular: Normal rate, regular rhythm and normal heart sounds.  Exam reveals no gallop and no friction rub.   No murmur heard. Pulmonary/Chest: Effort normal and breath sounds normal. No respiratory distress. She has no wheezes. She has no rales. She exhibits no tenderness.  Abdominal: She exhibits no distension and no mass. There is no tenderness. There is no rebound and no guarding.  Neurological: She is alert and oriented to person, place, and time. She exhibits normal muscle tone. Coordination normal.  Skin: Skin is warm and dry. She is not diaphoretic. No erythema. No pallor.  Psychiatric: She has a normal mood  and affect. Her behavior is normal. Judgment and thought content normal.          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  She can followup with [place] as needed certainly she has symptoms that suggest recurrence of her pulmonary cryptococcal infection she should see [place] as soon as possible  Ra: seeing Dr Ouida Sills

## 2013-08-20 NOTE — Patient Instructions (Signed)
If you have new cough, losing weight, fever anything to make [place] suspect return of the lung infection please come see [place] ASAP

## 2013-09-19 DIAGNOSIS — D509 Iron deficiency anemia, unspecified: Secondary | ICD-10-CM | POA: Diagnosis not present

## 2013-09-19 DIAGNOSIS — IMO0002 Reserved for concepts with insufficient information to code with codable children: Secondary | ICD-10-CM | POA: Diagnosis not present

## 2013-09-19 DIAGNOSIS — E785 Hyperlipidemia, unspecified: Secondary | ICD-10-CM | POA: Diagnosis not present

## 2013-09-19 DIAGNOSIS — Z1331 Encounter for screening for depression: Secondary | ICD-10-CM | POA: Diagnosis not present

## 2013-09-19 DIAGNOSIS — Z9181 History of falling: Secondary | ICD-10-CM | POA: Diagnosis not present

## 2013-09-19 DIAGNOSIS — I5030 Unspecified diastolic (congestive) heart failure: Secondary | ICD-10-CM | POA: Diagnosis not present

## 2013-09-19 DIAGNOSIS — M069 Rheumatoid arthritis, unspecified: Secondary | ICD-10-CM | POA: Diagnosis not present

## 2013-10-10 DIAGNOSIS — M159 Polyosteoarthritis, unspecified: Secondary | ICD-10-CM | POA: Diagnosis not present

## 2013-10-10 DIAGNOSIS — M810 Age-related osteoporosis without current pathological fracture: Secondary | ICD-10-CM | POA: Diagnosis not present

## 2013-10-10 DIAGNOSIS — M069 Rheumatoid arthritis, unspecified: Secondary | ICD-10-CM | POA: Diagnosis not present

## 2013-11-02 DIAGNOSIS — N390 Urinary tract infection, site not specified: Secondary | ICD-10-CM | POA: Diagnosis not present

## 2013-11-02 DIAGNOSIS — IMO0002 Reserved for concepts with insufficient information to code with codable children: Secondary | ICD-10-CM | POA: Diagnosis not present

## 2013-11-02 DIAGNOSIS — R4182 Altered mental status, unspecified: Secondary | ICD-10-CM | POA: Diagnosis not present

## 2013-11-02 DIAGNOSIS — H6120 Impacted cerumen, unspecified ear: Secondary | ICD-10-CM | POA: Diagnosis not present

## 2013-11-02 DIAGNOSIS — M47817 Spondylosis without myelopathy or radiculopathy, lumbosacral region: Secondary | ICD-10-CM | POA: Diagnosis not present

## 2013-12-27 DIAGNOSIS — Z23 Encounter for immunization: Secondary | ICD-10-CM | POA: Diagnosis not present

## 2013-12-27 DIAGNOSIS — I5030 Unspecified diastolic (congestive) heart failure: Secondary | ICD-10-CM | POA: Diagnosis not present

## 2013-12-27 DIAGNOSIS — H6121 Impacted cerumen, right ear: Secondary | ICD-10-CM | POA: Diagnosis not present

## 2013-12-27 DIAGNOSIS — H9193 Unspecified hearing loss, bilateral: Secondary | ICD-10-CM | POA: Diagnosis not present

## 2013-12-27 DIAGNOSIS — M069 Rheumatoid arthritis, unspecified: Secondary | ICD-10-CM | POA: Diagnosis not present

## 2013-12-27 DIAGNOSIS — Z681 Body mass index (BMI) 19 or less, adult: Secondary | ICD-10-CM | POA: Diagnosis not present

## 2013-12-27 DIAGNOSIS — E785 Hyperlipidemia, unspecified: Secondary | ICD-10-CM | POA: Diagnosis not present

## 2013-12-27 DIAGNOSIS — D509 Iron deficiency anemia, unspecified: Secondary | ICD-10-CM | POA: Diagnosis not present

## 2014-01-08 DIAGNOSIS — E785 Hyperlipidemia, unspecified: Secondary | ICD-10-CM | POA: Diagnosis not present

## 2014-01-08 DIAGNOSIS — I110 Hypertensive heart disease with heart failure: Secondary | ICD-10-CM | POA: Diagnosis not present

## 2014-01-08 DIAGNOSIS — I5032 Chronic diastolic (congestive) heart failure: Secondary | ICD-10-CM | POA: Diagnosis not present

## 2014-01-12 IMAGING — CT CT BIOPSY
1 of 2 series · 14 of 31 positions shown, 18 images · non-contrast
Comparison: none

CLINICAL DATA: Indeterminate left upper lobe nodules which are PET
positive concerning for malignancy.

[Series 2: routine chest · axial · 0.91mm/px · z∈[-228,-10]mm · 14 of 58 slices shown, 18 images]
[im 5/58  mediastinal]
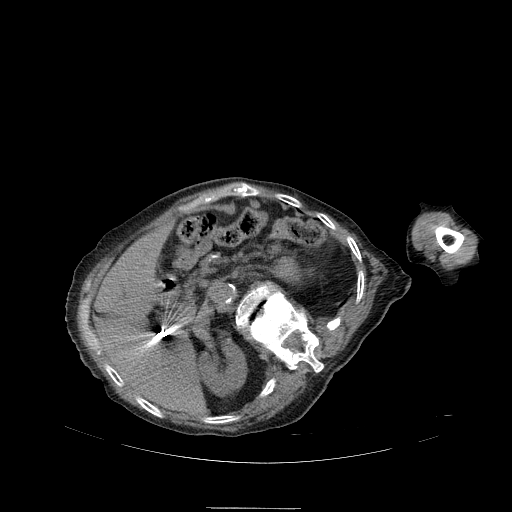
[im 5/58  lung]
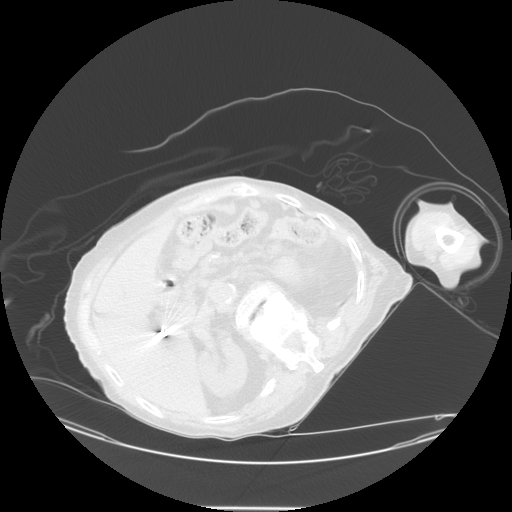
[im 9/58  lung]
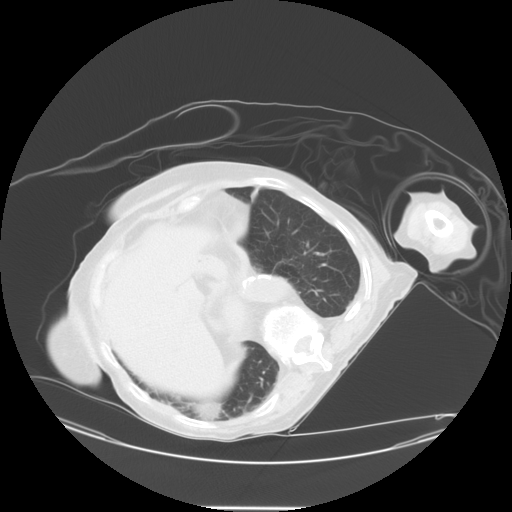
[im 14/58  lung]
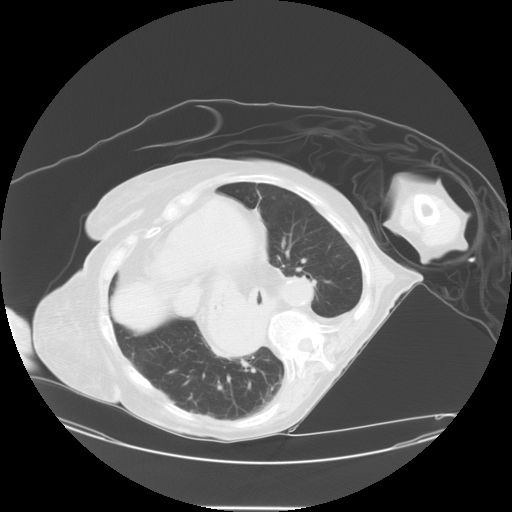
[im 18/58  lung]
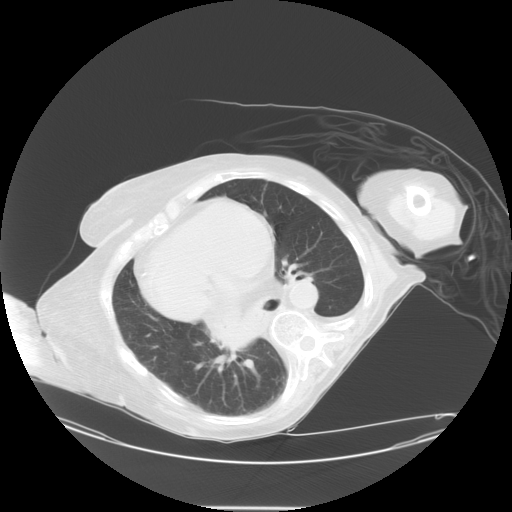
[im 22/58  mediastinal]
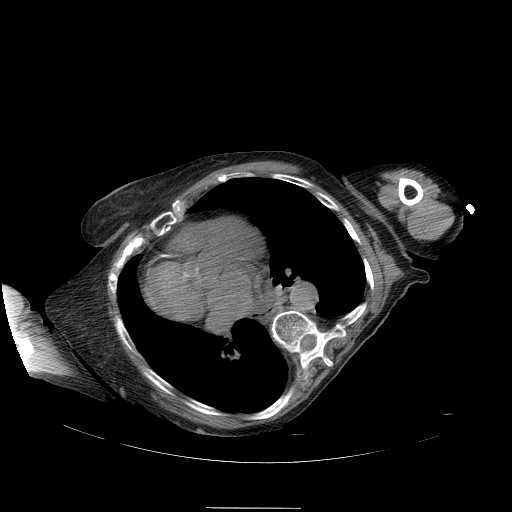
[im 22/58  lung]
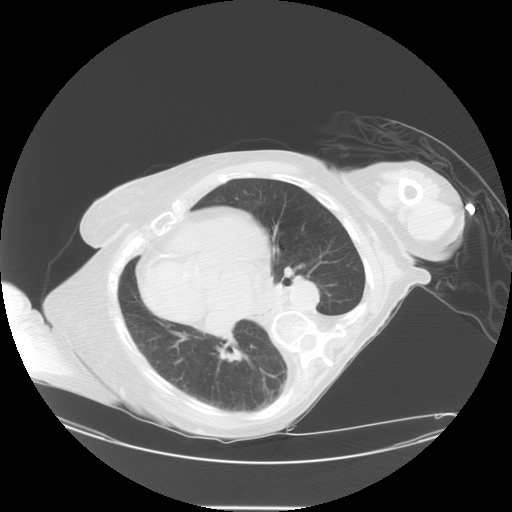
[im 25/58  lung]
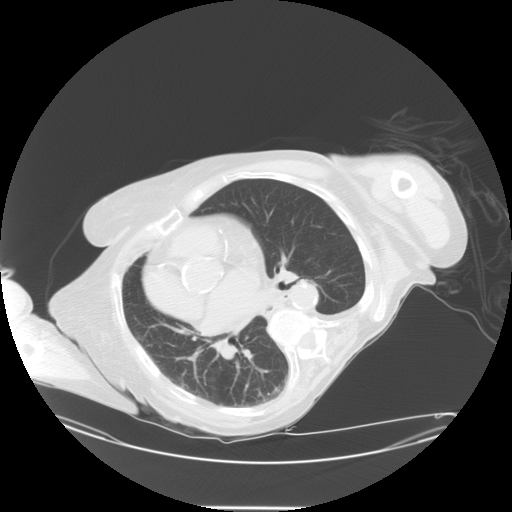
[im 27/58  lung]
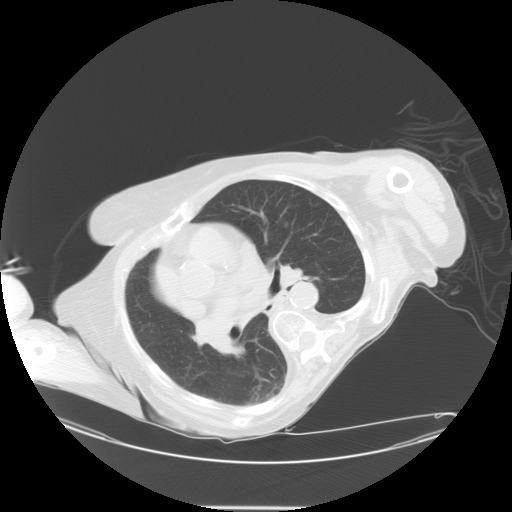
[im 29/58  lung]
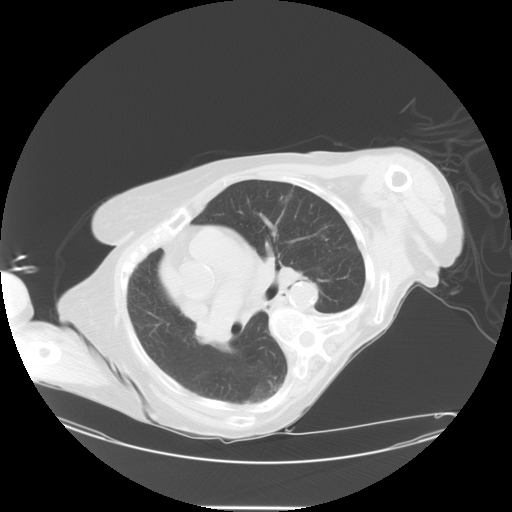
[im 31/58  mediastinal]
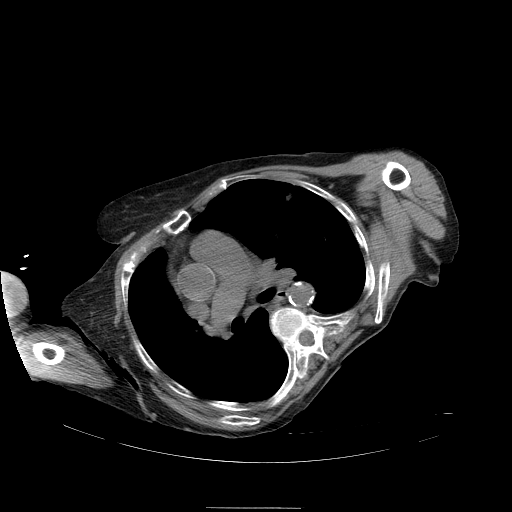
[im 31/58  lung]
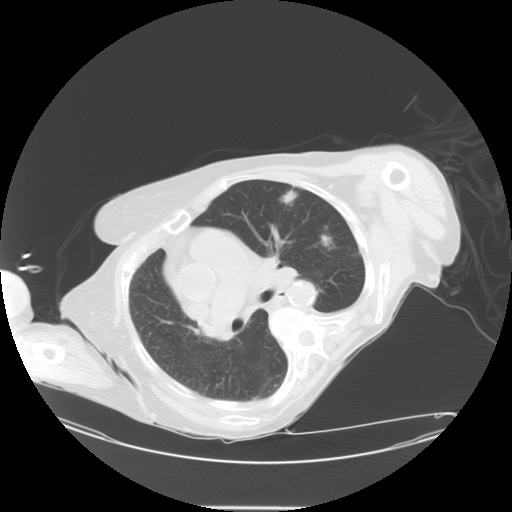
[im 36/58  lung]
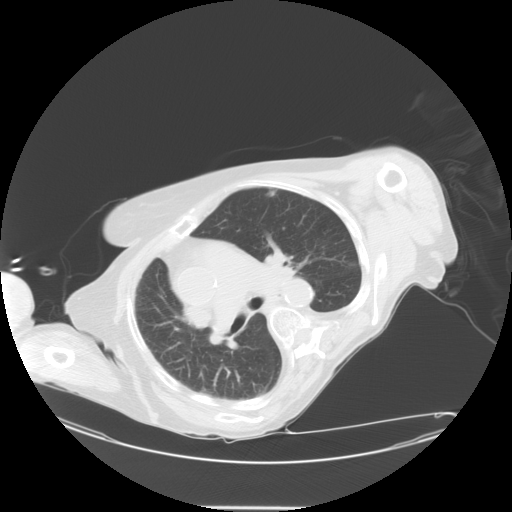
[im 40/58  lung]
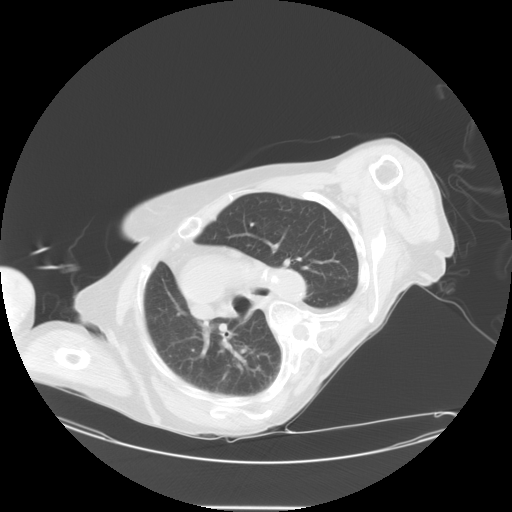
[im 44/58  lung]
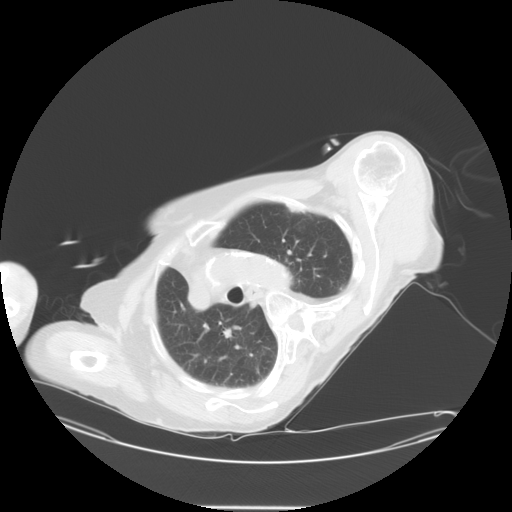
[im 49/58  mediastinal]
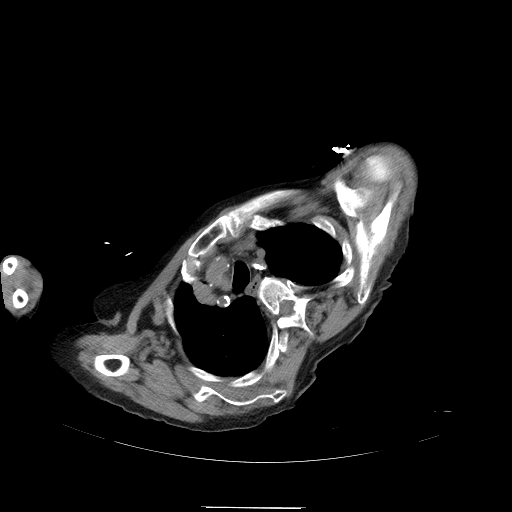
[im 49/58  lung]
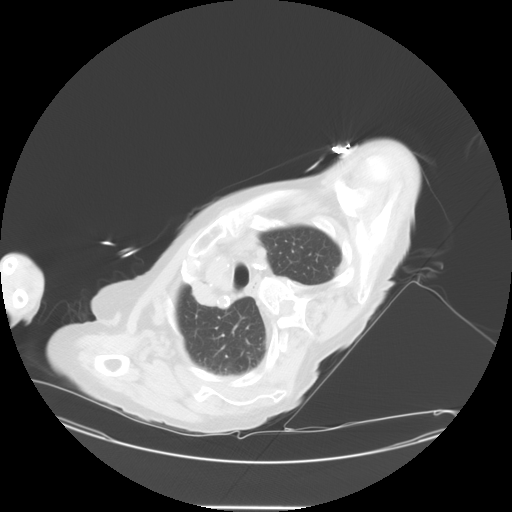
[im 53/58  lung]
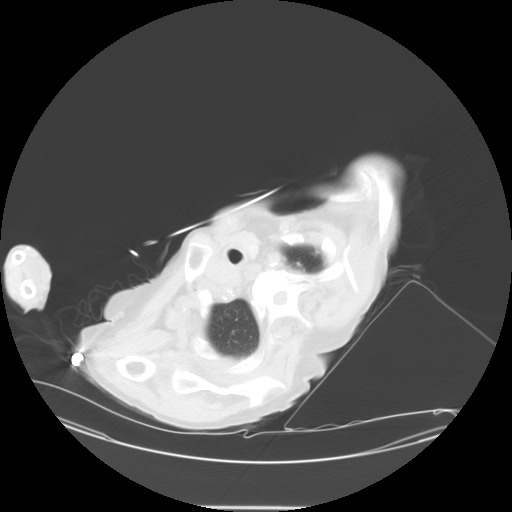

[14 of 31 positions shown; findings below may reference images not displayed]

CT FLUOROSCOPY GUIDED LEFT UPPER LOBE PERIPHERAL NODULE 18 GAUGE
CORE BIOPSY

Date:  04/03/2012 [DATE]

Radiologist:  Benjamin Nana Divinagracia, M.D.

Medications:  0.5 mg Versed, 12.5 mcg Fentanyl

Guidance:  CT fluoroscopy

Fluoroscopy time:  18 seconds

Sedation time:  20 minutes

Contrast volume:  None.

Complications:  No immediate

PROCEDURE/FINDINGS:

Informed consent was obtained from the patient following
explanation of the procedure, risks, benefits and alternatives.
The patient understands, agrees and consents for the procedure.
All questions were addressed.  A time out was performed.

Maximal barrier sterile technique utilized including caps, mask,
sterile gowns, sterile gloves, large sterile drape, hand hygiene,
and betadine

Previous imaging reviewed.  The patient was positioned right
posterior oblique.  Noncontrast CT localization performed.  One of
the suspicious left upper lobe peripheral nodules was localized.
Under sterile conditions and local anesthesia, CT fluoroscopy was
utilized to advance a 17 gauge 6.8 cm access needle into the
nodule.  Needle position confirmed with CT fluoroscopy.  2 18 gauge
core biopsies obtained.  These were placed in formalin.  Needle
retracted.  Small pneumothorax demonstrated by CT.  This was
syringe aspirated.  Repeat injection demonstrates a small residual
trace pneumothorax.  This did not appear to enlarge with subsequent
imaging.  The patient remained asymptomatic and stable.
IMPRESSION: Successful CT fluoroscopy guided left upper lobe peripheral nodule
18 gauge core biopsies

## 2014-01-12 IMAGING — CR DG CHEST 1V
1 series · 1 of 1 positions shown · non-contrast
Comparison: Chest x-ray 03/07/2012.

CLINICAL DATA: Status post left upper lobe nodule biopsy.

CHEST - 1 VIEW

[x chest ap]
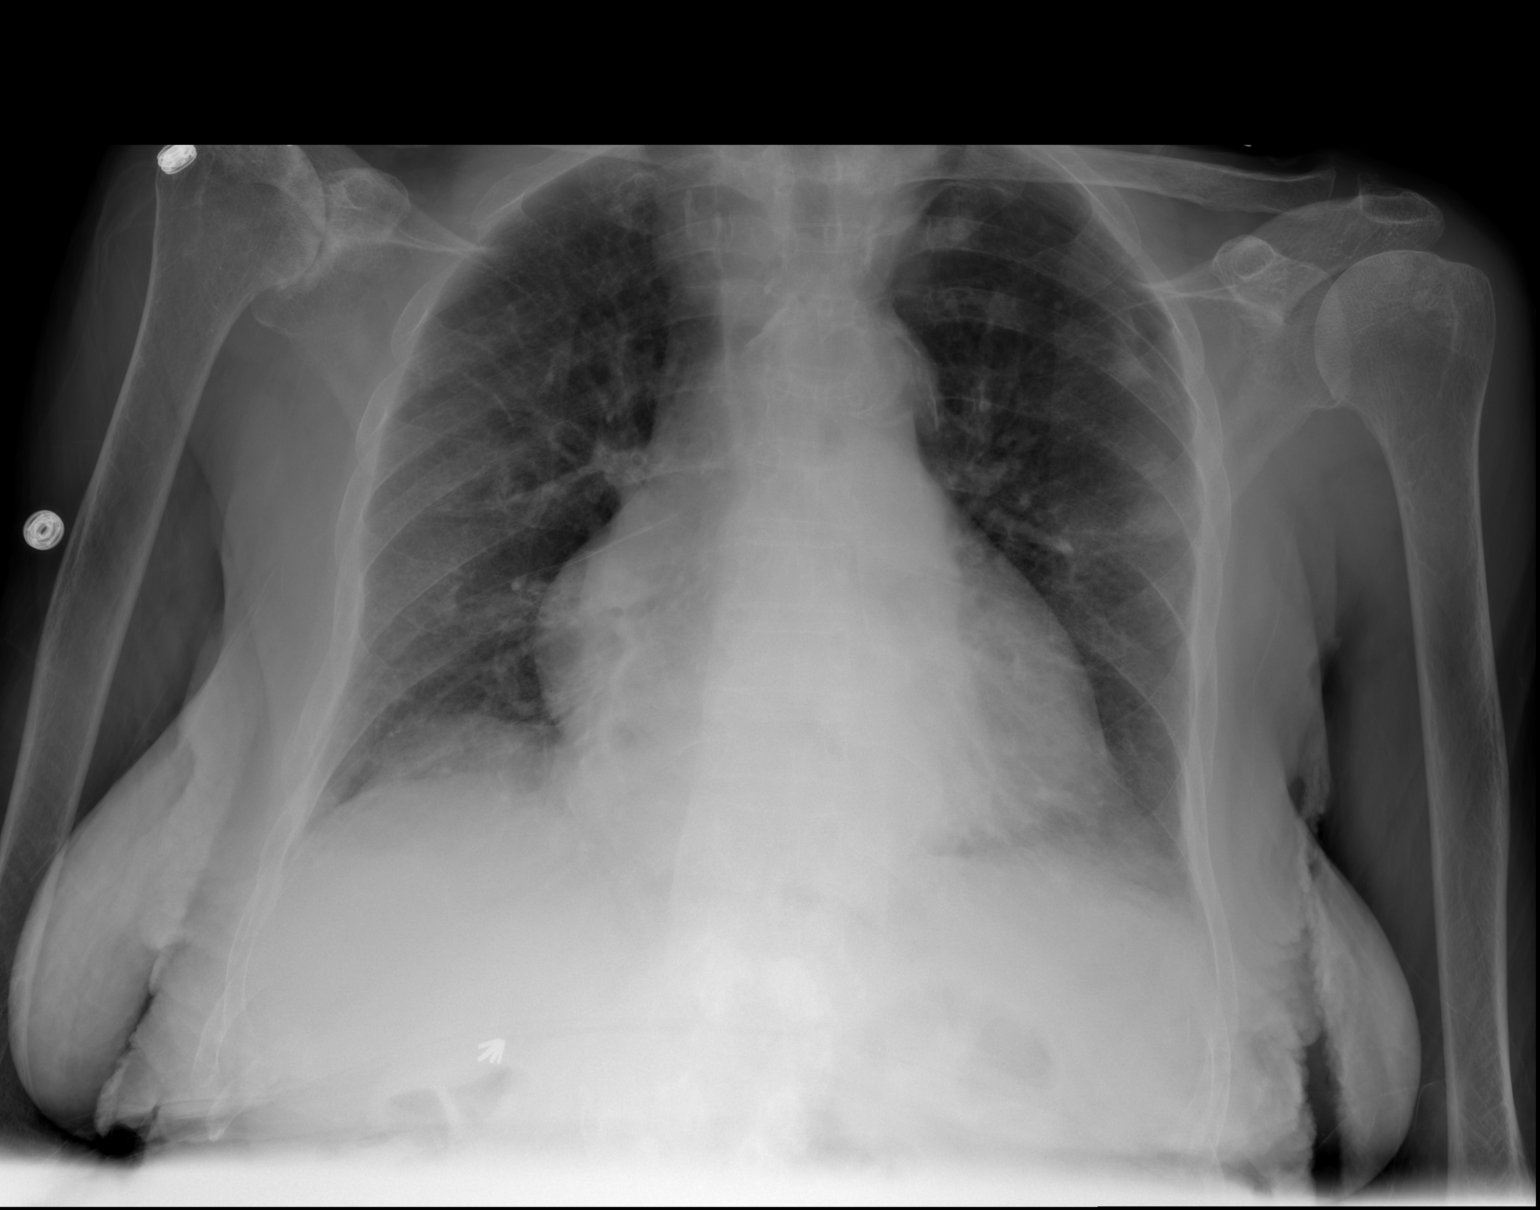

[1 of 1 positions shown; findings below may reference images not displayed]

FINDINGS: New small moderate left-sided pneumothorax (approximately
5-10% volume of the left hemithorax).  Previously noted left lung
nodules appear similar.  No consolidative airspace disease.  No
definite pleural effusions.  Mild cardiomegaly is unchanged.  No
evidence of pulmonary edema.  Extensive atherosclerosis in the
thoracic aorta.  Surgical clips project over the right upper
quadrant of the abdomen, likely from prior cholecystectomy.
IMPRESSION: 1.  New small to moderate left-sided pneumothorax (approximately 5-
10% volume of of the left hemithorax.
2.  Left-sided pulmonary nodules redemonstrated.
3.  Mild cardiomegaly (unchanged).
4.  Atherosclerosis.

to PA Sasha Kaye Tameika, who verbally acknowledged these results.

## 2014-02-07 DIAGNOSIS — M25511 Pain in right shoulder: Secondary | ICD-10-CM | POA: Diagnosis not present

## 2014-02-07 DIAGNOSIS — M810 Age-related osteoporosis without current pathological fracture: Secondary | ICD-10-CM | POA: Diagnosis not present

## 2014-02-07 DIAGNOSIS — M159 Polyosteoarthritis, unspecified: Secondary | ICD-10-CM | POA: Diagnosis not present

## 2014-02-07 DIAGNOSIS — M0579 Rheumatoid arthritis with rheumatoid factor of multiple sites without organ or systems involvement: Secondary | ICD-10-CM | POA: Diagnosis not present

## 2014-03-04 DIAGNOSIS — M810 Age-related osteoporosis without current pathological fracture: Secondary | ICD-10-CM | POA: Diagnosis not present

## 2014-04-04 DIAGNOSIS — M05741 Rheumatoid arthritis with rheumatoid factor of right hand without organ or systems involvement: Secondary | ICD-10-CM | POA: Diagnosis not present

## 2014-04-04 DIAGNOSIS — E785 Hyperlipidemia, unspecified: Secondary | ICD-10-CM | POA: Diagnosis not present

## 2014-04-04 DIAGNOSIS — Z681 Body mass index (BMI) 19 or less, adult: Secondary | ICD-10-CM | POA: Diagnosis not present

## 2014-04-04 DIAGNOSIS — D509 Iron deficiency anemia, unspecified: Secondary | ICD-10-CM | POA: Diagnosis not present

## 2014-04-04 DIAGNOSIS — I5032 Chronic diastolic (congestive) heart failure: Secondary | ICD-10-CM | POA: Diagnosis not present

## 2014-04-27 DIAGNOSIS — E780 Pure hypercholesterolemia: Secondary | ICD-10-CM | POA: Diagnosis not present

## 2014-04-27 DIAGNOSIS — R339 Retention of urine, unspecified: Secondary | ICD-10-CM | POA: Diagnosis not present

## 2014-04-27 DIAGNOSIS — I10 Essential (primary) hypertension: Secondary | ICD-10-CM | POA: Diagnosis not present

## 2014-04-27 DIAGNOSIS — I509 Heart failure, unspecified: Secondary | ICD-10-CM | POA: Diagnosis not present

## 2014-04-27 DIAGNOSIS — Z7952 Long term (current) use of systemic steroids: Secondary | ICD-10-CM | POA: Diagnosis not present

## 2014-05-09 DIAGNOSIS — M159 Polyosteoarthritis, unspecified: Secondary | ICD-10-CM | POA: Diagnosis not present

## 2014-05-09 DIAGNOSIS — M0579 Rheumatoid arthritis with rheumatoid factor of multiple sites without organ or systems involvement: Secondary | ICD-10-CM | POA: Diagnosis not present

## 2014-05-09 DIAGNOSIS — M810 Age-related osteoporosis without current pathological fracture: Secondary | ICD-10-CM | POA: Diagnosis not present

## 2014-05-13 IMAGING — CR DG CHEST 2V
2 series · 2 of 2 positions shown · non-contrast
Comparison: 07/09/2012

CLINICAL DATA: Known left lung mass, shortness of breath

CHEST - 2 VIEW

[w chest pa]
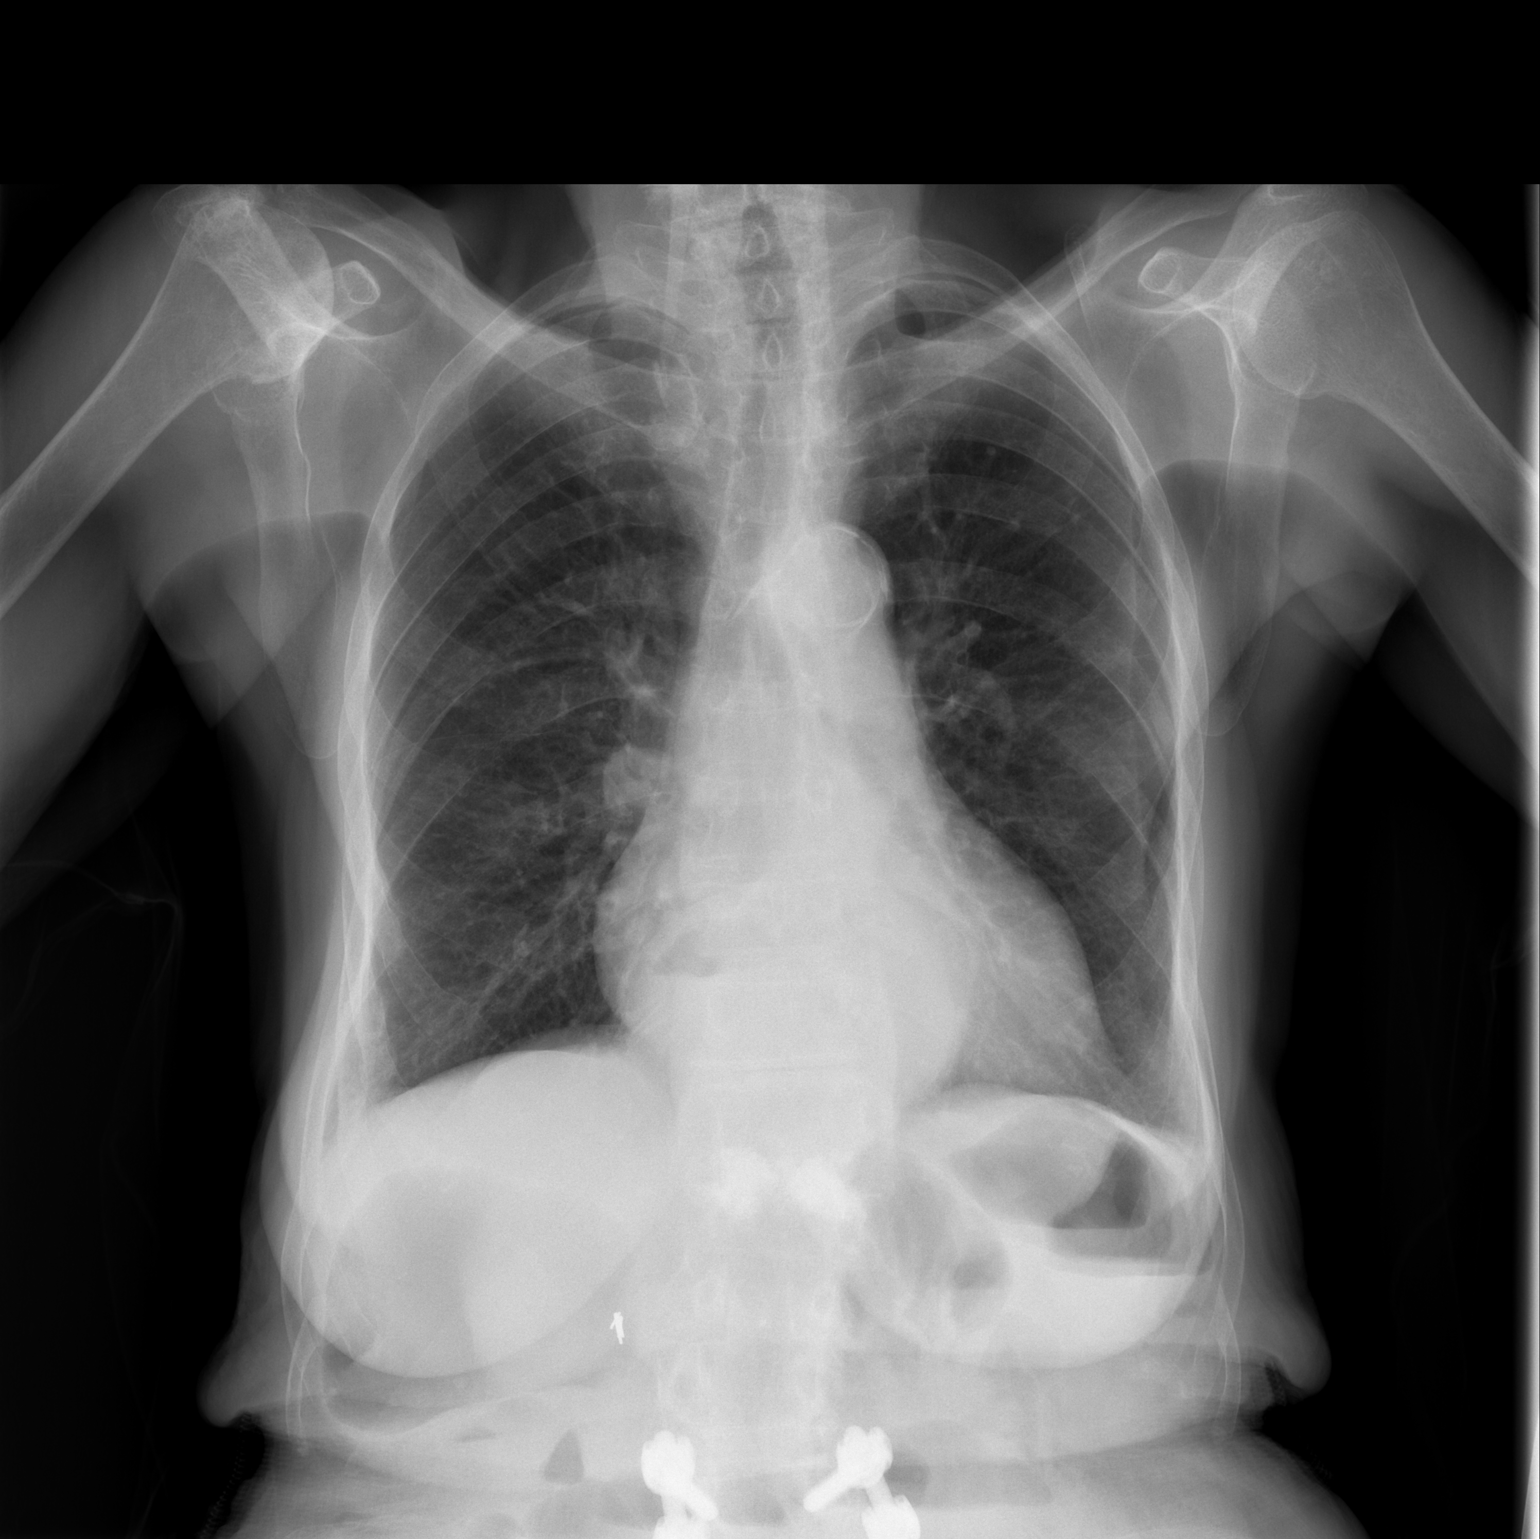

[w chest lat]
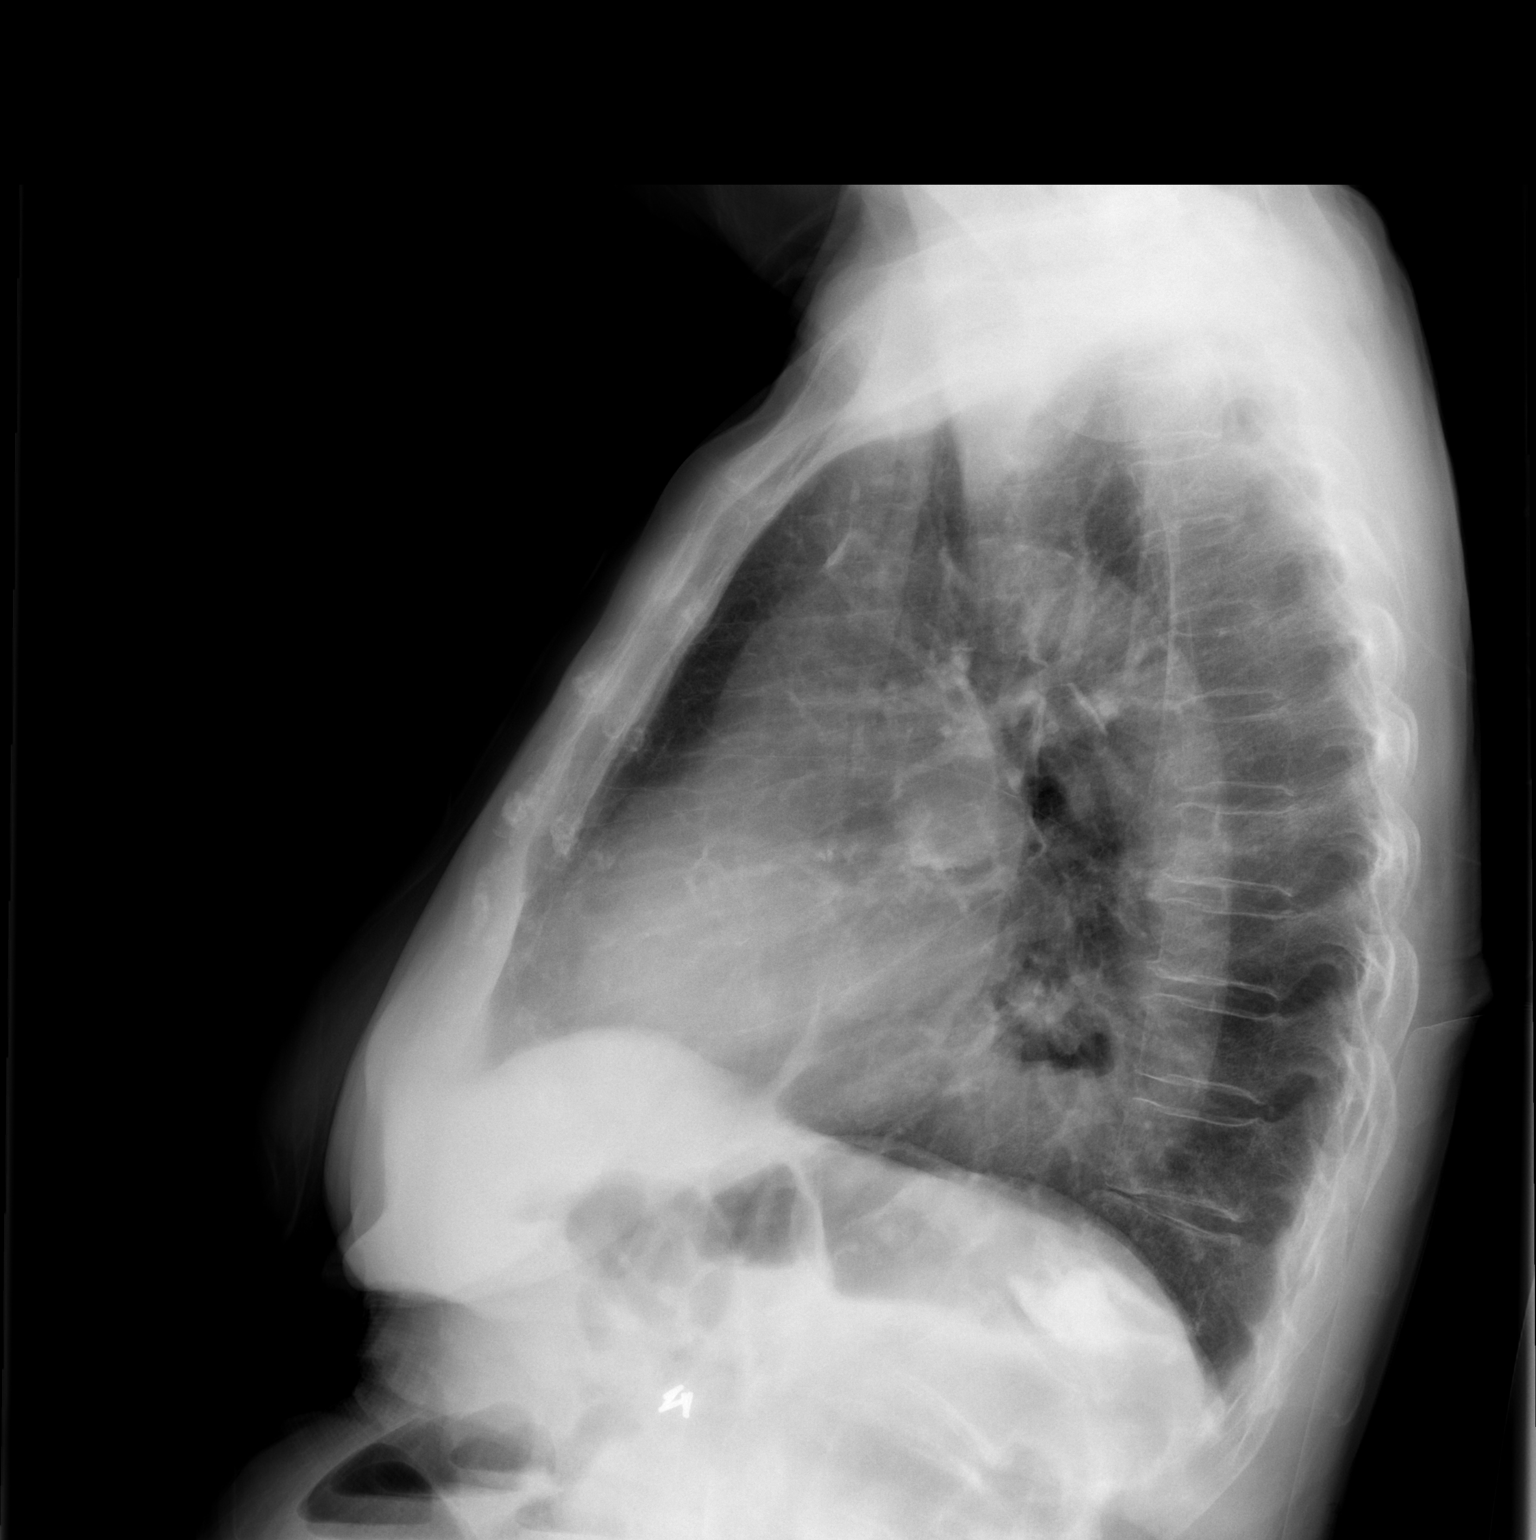

[2 of 2 positions shown; findings below may reference images not displayed]

FINDINGS: The large hiatal hernia is again noted.  Cardiac shadow
is stable.  The previously seen left upper lobe densities are less
well visualized on the current exam.  Changes of prior
thoracolumbar surgery are again seen.
IMPRESSION: No acute abnormality is noted.

## 2014-07-03 DIAGNOSIS — S51801A Unspecified open wound of right forearm, initial encounter: Secondary | ICD-10-CM | POA: Diagnosis not present

## 2014-07-03 DIAGNOSIS — Z23 Encounter for immunization: Secondary | ICD-10-CM | POA: Diagnosis not present

## 2014-07-03 DIAGNOSIS — Z681 Body mass index (BMI) 19 or less, adult: Secondary | ICD-10-CM | POA: Diagnosis not present

## 2014-07-11 DIAGNOSIS — M05741 Rheumatoid arthritis with rheumatoid factor of right hand without organ or systems involvement: Secondary | ICD-10-CM | POA: Diagnosis not present

## 2014-07-11 DIAGNOSIS — D509 Iron deficiency anemia, unspecified: Secondary | ICD-10-CM | POA: Diagnosis not present

## 2014-07-11 DIAGNOSIS — I5032 Chronic diastolic (congestive) heart failure: Secondary | ICD-10-CM | POA: Diagnosis not present

## 2014-07-11 DIAGNOSIS — E785 Hyperlipidemia, unspecified: Secondary | ICD-10-CM | POA: Diagnosis not present

## 2014-08-01 DIAGNOSIS — S51811A Laceration without foreign body of right forearm, initial encounter: Secondary | ICD-10-CM | POA: Diagnosis not present

## 2014-08-01 DIAGNOSIS — Z681 Body mass index (BMI) 19 or less, adult: Secondary | ICD-10-CM | POA: Diagnosis not present

## 2014-08-06 DIAGNOSIS — N3289 Other specified disorders of bladder: Secondary | ICD-10-CM | POA: Diagnosis not present

## 2014-08-06 DIAGNOSIS — R109 Unspecified abdominal pain: Secondary | ICD-10-CM | POA: Diagnosis not present

## 2014-08-06 DIAGNOSIS — R339 Retention of urine, unspecified: Secondary | ICD-10-CM | POA: Diagnosis not present

## 2014-08-06 DIAGNOSIS — K6389 Other specified diseases of intestine: Secondary | ICD-10-CM | POA: Diagnosis not present

## 2014-08-06 DIAGNOSIS — N368 Other specified disorders of urethra: Secondary | ICD-10-CM | POA: Diagnosis not present

## 2014-08-13 DIAGNOSIS — R338 Other retention of urine: Secondary | ICD-10-CM | POA: Diagnosis not present

## 2014-08-13 DIAGNOSIS — M25552 Pain in left hip: Secondary | ICD-10-CM | POA: Diagnosis not present

## 2014-08-13 DIAGNOSIS — Z681 Body mass index (BMI) 19 or less, adult: Secondary | ICD-10-CM | POA: Diagnosis not present

## 2014-08-19 DIAGNOSIS — Z681 Body mass index (BMI) 19 or less, adult: Secondary | ICD-10-CM | POA: Diagnosis not present

## 2014-08-19 DIAGNOSIS — J069 Acute upper respiratory infection, unspecified: Secondary | ICD-10-CM | POA: Diagnosis not present

## 2014-09-03 DIAGNOSIS — M810 Age-related osteoporosis without current pathological fracture: Secondary | ICD-10-CM | POA: Diagnosis not present

## 2014-09-03 DIAGNOSIS — M159 Polyosteoarthritis, unspecified: Secondary | ICD-10-CM | POA: Diagnosis not present

## 2014-09-03 DIAGNOSIS — M0579 Rheumatoid arthritis with rheumatoid factor of multiple sites without organ or systems involvement: Secondary | ICD-10-CM | POA: Diagnosis not present

## 2014-10-18 DIAGNOSIS — E785 Hyperlipidemia, unspecified: Secondary | ICD-10-CM | POA: Diagnosis not present

## 2014-10-18 DIAGNOSIS — D509 Iron deficiency anemia, unspecified: Secondary | ICD-10-CM | POA: Diagnosis not present

## 2014-10-18 DIAGNOSIS — Z9181 History of falling: Secondary | ICD-10-CM | POA: Diagnosis not present

## 2014-10-18 DIAGNOSIS — Z681 Body mass index (BMI) 19 or less, adult: Secondary | ICD-10-CM | POA: Diagnosis not present

## 2014-10-18 DIAGNOSIS — I5032 Chronic diastolic (congestive) heart failure: Secondary | ICD-10-CM | POA: Diagnosis not present

## 2014-10-18 DIAGNOSIS — M05741 Rheumatoid arthritis with rheumatoid factor of right hand without organ or systems involvement: Secondary | ICD-10-CM | POA: Diagnosis not present

## 2014-10-18 DIAGNOSIS — M5137 Other intervertebral disc degeneration, lumbosacral region: Secondary | ICD-10-CM | POA: Diagnosis not present

## 2014-12-02 DIAGNOSIS — M2550 Pain in unspecified joint: Secondary | ICD-10-CM | POA: Diagnosis not present

## 2014-12-02 DIAGNOSIS — Z79899 Other long term (current) drug therapy: Secondary | ICD-10-CM | POA: Diagnosis not present

## 2014-12-02 DIAGNOSIS — M810 Age-related osteoporosis without current pathological fracture: Secondary | ICD-10-CM | POA: Diagnosis not present

## 2014-12-02 DIAGNOSIS — M0589 Other rheumatoid arthritis with rheumatoid factor of multiple sites: Secondary | ICD-10-CM | POA: Diagnosis not present

## 2014-12-02 DIAGNOSIS — M545 Low back pain: Secondary | ICD-10-CM | POA: Diagnosis not present

## 2015-01-23 DIAGNOSIS — D509 Iron deficiency anemia, unspecified: Secondary | ICD-10-CM | POA: Diagnosis not present

## 2015-01-23 DIAGNOSIS — E785 Hyperlipidemia, unspecified: Secondary | ICD-10-CM | POA: Diagnosis not present

## 2015-01-23 DIAGNOSIS — I5032 Chronic diastolic (congestive) heart failure: Secondary | ICD-10-CM | POA: Diagnosis not present

## 2015-01-23 DIAGNOSIS — Z6820 Body mass index (BMI) 20.0-20.9, adult: Secondary | ICD-10-CM | POA: Diagnosis not present

## 2015-01-23 DIAGNOSIS — M5137 Other intervertebral disc degeneration, lumbosacral region: Secondary | ICD-10-CM | POA: Diagnosis not present

## 2015-01-23 DIAGNOSIS — M05741 Rheumatoid arthritis with rheumatoid factor of right hand without organ or systems involvement: Secondary | ICD-10-CM | POA: Diagnosis not present

## 2015-01-23 DIAGNOSIS — Z23 Encounter for immunization: Secondary | ICD-10-CM | POA: Diagnosis not present

## 2015-02-13 DIAGNOSIS — I5032 Chronic diastolic (congestive) heart failure: Secondary | ICD-10-CM | POA: Diagnosis not present

## 2015-02-13 DIAGNOSIS — E785 Hyperlipidemia, unspecified: Secondary | ICD-10-CM | POA: Diagnosis not present

## 2015-02-13 DIAGNOSIS — I110 Hypertensive heart disease with heart failure: Secondary | ICD-10-CM | POA: Diagnosis not present

## 2015-02-19 DIAGNOSIS — M545 Low back pain: Secondary | ICD-10-CM | POA: Diagnosis not present

## 2015-02-19 DIAGNOSIS — S20219A Contusion of unspecified front wall of thorax, initial encounter: Secondary | ICD-10-CM | POA: Diagnosis not present

## 2015-02-20 DIAGNOSIS — R296 Repeated falls: Secondary | ICD-10-CM | POA: Diagnosis not present

## 2015-02-20 DIAGNOSIS — S2232XA Fracture of one rib, left side, initial encounter for closed fracture: Secondary | ICD-10-CM | POA: Diagnosis not present

## 2015-02-20 DIAGNOSIS — Z6820 Body mass index (BMI) 20.0-20.9, adult: Secondary | ICD-10-CM | POA: Diagnosis not present

## 2015-02-20 DIAGNOSIS — H6121 Impacted cerumen, right ear: Secondary | ICD-10-CM | POA: Diagnosis not present

## 2015-02-27 DIAGNOSIS — M810 Age-related osteoporosis without current pathological fracture: Secondary | ICD-10-CM | POA: Diagnosis not present

## 2015-02-27 DIAGNOSIS — M545 Low back pain: Secondary | ICD-10-CM | POA: Diagnosis not present

## 2015-02-27 DIAGNOSIS — M0589 Other rheumatoid arthritis with rheumatoid factor of multiple sites: Secondary | ICD-10-CM | POA: Diagnosis not present

## 2015-02-27 DIAGNOSIS — Z79899 Other long term (current) drug therapy: Secondary | ICD-10-CM | POA: Diagnosis not present

## 2015-02-27 DIAGNOSIS — M2550 Pain in unspecified joint: Secondary | ICD-10-CM | POA: Diagnosis not present

## 2015-03-06 DIAGNOSIS — M810 Age-related osteoporosis without current pathological fracture: Secondary | ICD-10-CM | POA: Diagnosis not present

## 2015-03-30 DIAGNOSIS — J209 Acute bronchitis, unspecified: Secondary | ICD-10-CM | POA: Diagnosis not present

## 2015-03-31 DIAGNOSIS — I272 Other secondary pulmonary hypertension: Secondary | ICD-10-CM | POA: Diagnosis not present

## 2015-03-31 DIAGNOSIS — E785 Hyperlipidemia, unspecified: Secondary | ICD-10-CM | POA: Diagnosis not present

## 2015-03-31 DIAGNOSIS — Z79899 Other long term (current) drug therapy: Secondary | ICD-10-CM | POA: Diagnosis not present

## 2015-03-31 DIAGNOSIS — J189 Pneumonia, unspecified organism: Secondary | ICD-10-CM | POA: Diagnosis not present

## 2015-03-31 DIAGNOSIS — E7800 Pure hypercholesterolemia, unspecified: Secondary | ICD-10-CM | POA: Diagnosis present

## 2015-03-31 DIAGNOSIS — I5041 Acute combined systolic (congestive) and diastolic (congestive) heart failure: Secondary | ICD-10-CM | POA: Diagnosis present

## 2015-03-31 DIAGNOSIS — M069 Rheumatoid arthritis, unspecified: Secondary | ICD-10-CM | POA: Diagnosis present

## 2015-03-31 DIAGNOSIS — R0602 Shortness of breath: Secondary | ICD-10-CM | POA: Diagnosis not present

## 2015-03-31 DIAGNOSIS — J441 Chronic obstructive pulmonary disease with (acute) exacerbation: Secondary | ICD-10-CM | POA: Diagnosis not present

## 2015-03-31 DIAGNOSIS — M1990 Unspecified osteoarthritis, unspecified site: Secondary | ICD-10-CM | POA: Diagnosis present

## 2015-03-31 DIAGNOSIS — R7989 Other specified abnormal findings of blood chemistry: Secondary | ICD-10-CM | POA: Diagnosis not present

## 2015-03-31 DIAGNOSIS — I10 Essential (primary) hypertension: Secondary | ICD-10-CM | POA: Diagnosis not present

## 2015-03-31 DIAGNOSIS — K219 Gastro-esophageal reflux disease without esophagitis: Secondary | ICD-10-CM | POA: Diagnosis present

## 2015-03-31 DIAGNOSIS — Z91041 Radiographic dye allergy status: Secondary | ICD-10-CM | POA: Diagnosis not present

## 2015-03-31 DIAGNOSIS — M810 Age-related osteoporosis without current pathological fracture: Secondary | ICD-10-CM | POA: Diagnosis present

## 2015-03-31 DIAGNOSIS — J440 Chronic obstructive pulmonary disease with acute lower respiratory infection: Secondary | ICD-10-CM | POA: Diagnosis present

## 2015-04-04 DIAGNOSIS — I5041 Acute combined systolic (congestive) and diastolic (congestive) heart failure: Secondary | ICD-10-CM | POA: Diagnosis not present

## 2015-04-04 DIAGNOSIS — Z6820 Body mass index (BMI) 20.0-20.9, adult: Secondary | ICD-10-CM | POA: Diagnosis not present

## 2015-04-04 DIAGNOSIS — Z9181 History of falling: Secondary | ICD-10-CM | POA: Diagnosis not present

## 2015-04-04 DIAGNOSIS — Z7982 Long term (current) use of aspirin: Secondary | ICD-10-CM | POA: Diagnosis not present

## 2015-04-04 DIAGNOSIS — I110 Hypertensive heart disease with heart failure: Secondary | ICD-10-CM | POA: Diagnosis not present

## 2015-04-04 DIAGNOSIS — J441 Chronic obstructive pulmonary disease with (acute) exacerbation: Secondary | ICD-10-CM | POA: Diagnosis not present

## 2015-04-04 DIAGNOSIS — J189 Pneumonia, unspecified organism: Secondary | ICD-10-CM | POA: Diagnosis not present

## 2015-04-04 DIAGNOSIS — Z7952 Long term (current) use of systemic steroids: Secondary | ICD-10-CM | POA: Diagnosis not present

## 2015-04-04 DIAGNOSIS — R7989 Other specified abnormal findings of blood chemistry: Secondary | ICD-10-CM | POA: Diagnosis not present

## 2015-04-07 DIAGNOSIS — Z7952 Long term (current) use of systemic steroids: Secondary | ICD-10-CM | POA: Diagnosis not present

## 2015-04-07 DIAGNOSIS — Z7982 Long term (current) use of aspirin: Secondary | ICD-10-CM | POA: Diagnosis not present

## 2015-04-07 DIAGNOSIS — I110 Hypertensive heart disease with heart failure: Secondary | ICD-10-CM | POA: Diagnosis not present

## 2015-04-07 DIAGNOSIS — Z9181 History of falling: Secondary | ICD-10-CM | POA: Diagnosis not present

## 2015-04-07 DIAGNOSIS — J441 Chronic obstructive pulmonary disease with (acute) exacerbation: Secondary | ICD-10-CM | POA: Diagnosis not present

## 2015-04-07 DIAGNOSIS — I5041 Acute combined systolic (congestive) and diastolic (congestive) heart failure: Secondary | ICD-10-CM | POA: Diagnosis not present

## 2015-04-10 DIAGNOSIS — Z7952 Long term (current) use of systemic steroids: Secondary | ICD-10-CM | POA: Diagnosis not present

## 2015-04-10 DIAGNOSIS — Z7982 Long term (current) use of aspirin: Secondary | ICD-10-CM | POA: Diagnosis not present

## 2015-04-10 DIAGNOSIS — Z9181 History of falling: Secondary | ICD-10-CM | POA: Diagnosis not present

## 2015-04-10 DIAGNOSIS — I110 Hypertensive heart disease with heart failure: Secondary | ICD-10-CM | POA: Diagnosis not present

## 2015-04-10 DIAGNOSIS — J441 Chronic obstructive pulmonary disease with (acute) exacerbation: Secondary | ICD-10-CM | POA: Diagnosis not present

## 2015-04-10 DIAGNOSIS — I5041 Acute combined systolic (congestive) and diastolic (congestive) heart failure: Secondary | ICD-10-CM | POA: Diagnosis not present

## 2015-04-14 DIAGNOSIS — J441 Chronic obstructive pulmonary disease with (acute) exacerbation: Secondary | ICD-10-CM | POA: Diagnosis not present

## 2015-04-14 DIAGNOSIS — Z7952 Long term (current) use of systemic steroids: Secondary | ICD-10-CM | POA: Diagnosis not present

## 2015-04-14 DIAGNOSIS — Z9181 History of falling: Secondary | ICD-10-CM | POA: Diagnosis not present

## 2015-04-14 DIAGNOSIS — I5041 Acute combined systolic (congestive) and diastolic (congestive) heart failure: Secondary | ICD-10-CM | POA: Diagnosis not present

## 2015-04-14 DIAGNOSIS — I110 Hypertensive heart disease with heart failure: Secondary | ICD-10-CM | POA: Diagnosis not present

## 2015-04-14 DIAGNOSIS — Z7982 Long term (current) use of aspirin: Secondary | ICD-10-CM | POA: Diagnosis not present

## 2015-04-15 DIAGNOSIS — Z6820 Body mass index (BMI) 20.0-20.9, adult: Secondary | ICD-10-CM | POA: Diagnosis not present

## 2015-04-15 DIAGNOSIS — J208 Acute bronchitis due to other specified organisms: Secondary | ICD-10-CM | POA: Diagnosis not present

## 2015-04-16 DIAGNOSIS — Z7952 Long term (current) use of systemic steroids: Secondary | ICD-10-CM | POA: Diagnosis not present

## 2015-04-16 DIAGNOSIS — Z9181 History of falling: Secondary | ICD-10-CM | POA: Diagnosis not present

## 2015-04-16 DIAGNOSIS — I5041 Acute combined systolic (congestive) and diastolic (congestive) heart failure: Secondary | ICD-10-CM | POA: Diagnosis not present

## 2015-04-16 DIAGNOSIS — J441 Chronic obstructive pulmonary disease with (acute) exacerbation: Secondary | ICD-10-CM | POA: Diagnosis not present

## 2015-04-16 DIAGNOSIS — I110 Hypertensive heart disease with heart failure: Secondary | ICD-10-CM | POA: Diagnosis not present

## 2015-04-16 DIAGNOSIS — Z7982 Long term (current) use of aspirin: Secondary | ICD-10-CM | POA: Diagnosis not present

## 2015-04-17 DIAGNOSIS — Z7982 Long term (current) use of aspirin: Secondary | ICD-10-CM | POA: Diagnosis not present

## 2015-04-17 DIAGNOSIS — I110 Hypertensive heart disease with heart failure: Secondary | ICD-10-CM | POA: Diagnosis not present

## 2015-04-17 DIAGNOSIS — I5041 Acute combined systolic (congestive) and diastolic (congestive) heart failure: Secondary | ICD-10-CM | POA: Diagnosis not present

## 2015-04-17 DIAGNOSIS — Z9181 History of falling: Secondary | ICD-10-CM | POA: Diagnosis not present

## 2015-04-17 DIAGNOSIS — I952 Hypotension due to drugs: Secondary | ICD-10-CM | POA: Diagnosis not present

## 2015-04-17 DIAGNOSIS — Z6820 Body mass index (BMI) 20.0-20.9, adult: Secondary | ICD-10-CM | POA: Diagnosis not present

## 2015-04-17 DIAGNOSIS — Z7952 Long term (current) use of systemic steroids: Secondary | ICD-10-CM | POA: Diagnosis not present

## 2015-04-17 DIAGNOSIS — J441 Chronic obstructive pulmonary disease with (acute) exacerbation: Secondary | ICD-10-CM | POA: Diagnosis not present

## 2015-04-21 DIAGNOSIS — I5041 Acute combined systolic (congestive) and diastolic (congestive) heart failure: Secondary | ICD-10-CM | POA: Diagnosis not present

## 2015-04-21 DIAGNOSIS — Z9181 History of falling: Secondary | ICD-10-CM | POA: Diagnosis not present

## 2015-04-21 DIAGNOSIS — I110 Hypertensive heart disease with heart failure: Secondary | ICD-10-CM | POA: Diagnosis not present

## 2015-04-21 DIAGNOSIS — Z7952 Long term (current) use of systemic steroids: Secondary | ICD-10-CM | POA: Diagnosis not present

## 2015-04-21 DIAGNOSIS — J441 Chronic obstructive pulmonary disease with (acute) exacerbation: Secondary | ICD-10-CM | POA: Diagnosis not present

## 2015-04-21 DIAGNOSIS — Z7982 Long term (current) use of aspirin: Secondary | ICD-10-CM | POA: Diagnosis not present

## 2015-04-24 DIAGNOSIS — Z7952 Long term (current) use of systemic steroids: Secondary | ICD-10-CM | POA: Diagnosis not present

## 2015-04-24 DIAGNOSIS — Z7982 Long term (current) use of aspirin: Secondary | ICD-10-CM | POA: Diagnosis not present

## 2015-04-24 DIAGNOSIS — I5041 Acute combined systolic (congestive) and diastolic (congestive) heart failure: Secondary | ICD-10-CM | POA: Diagnosis not present

## 2015-04-24 DIAGNOSIS — Z9181 History of falling: Secondary | ICD-10-CM | POA: Diagnosis not present

## 2015-04-24 DIAGNOSIS — J441 Chronic obstructive pulmonary disease with (acute) exacerbation: Secondary | ICD-10-CM | POA: Diagnosis not present

## 2015-04-24 DIAGNOSIS — I110 Hypertensive heart disease with heart failure: Secondary | ICD-10-CM | POA: Diagnosis not present

## 2015-04-25 DIAGNOSIS — Z7952 Long term (current) use of systemic steroids: Secondary | ICD-10-CM | POA: Diagnosis not present

## 2015-04-25 DIAGNOSIS — I5041 Acute combined systolic (congestive) and diastolic (congestive) heart failure: Secondary | ICD-10-CM | POA: Diagnosis not present

## 2015-04-25 DIAGNOSIS — Z7982 Long term (current) use of aspirin: Secondary | ICD-10-CM | POA: Diagnosis not present

## 2015-04-25 DIAGNOSIS — I110 Hypertensive heart disease with heart failure: Secondary | ICD-10-CM | POA: Diagnosis not present

## 2015-04-25 DIAGNOSIS — J441 Chronic obstructive pulmonary disease with (acute) exacerbation: Secondary | ICD-10-CM | POA: Diagnosis not present

## 2015-04-25 DIAGNOSIS — Z9181 History of falling: Secondary | ICD-10-CM | POA: Diagnosis not present

## 2015-04-28 DIAGNOSIS — Z7952 Long term (current) use of systemic steroids: Secondary | ICD-10-CM | POA: Diagnosis not present

## 2015-04-28 DIAGNOSIS — Z9181 History of falling: Secondary | ICD-10-CM | POA: Diagnosis not present

## 2015-04-28 DIAGNOSIS — I5041 Acute combined systolic (congestive) and diastolic (congestive) heart failure: Secondary | ICD-10-CM | POA: Diagnosis not present

## 2015-04-28 DIAGNOSIS — Z7982 Long term (current) use of aspirin: Secondary | ICD-10-CM | POA: Diagnosis not present

## 2015-04-28 DIAGNOSIS — J441 Chronic obstructive pulmonary disease with (acute) exacerbation: Secondary | ICD-10-CM | POA: Diagnosis not present

## 2015-04-28 DIAGNOSIS — I110 Hypertensive heart disease with heart failure: Secondary | ICD-10-CM | POA: Diagnosis not present

## 2015-05-01 DIAGNOSIS — I5041 Acute combined systolic (congestive) and diastolic (congestive) heart failure: Secondary | ICD-10-CM | POA: Diagnosis not present

## 2015-05-01 DIAGNOSIS — Z7952 Long term (current) use of systemic steroids: Secondary | ICD-10-CM | POA: Diagnosis not present

## 2015-05-01 DIAGNOSIS — Z9181 History of falling: Secondary | ICD-10-CM | POA: Diagnosis not present

## 2015-05-01 DIAGNOSIS — J441 Chronic obstructive pulmonary disease with (acute) exacerbation: Secondary | ICD-10-CM | POA: Diagnosis not present

## 2015-05-01 DIAGNOSIS — Z7982 Long term (current) use of aspirin: Secondary | ICD-10-CM | POA: Diagnosis not present

## 2015-05-01 DIAGNOSIS — I110 Hypertensive heart disease with heart failure: Secondary | ICD-10-CM | POA: Diagnosis not present

## 2015-05-05 DIAGNOSIS — D509 Iron deficiency anemia, unspecified: Secondary | ICD-10-CM | POA: Diagnosis not present

## 2015-05-05 DIAGNOSIS — M5137 Other intervertebral disc degeneration, lumbosacral region: Secondary | ICD-10-CM | POA: Diagnosis not present

## 2015-05-05 DIAGNOSIS — E785 Hyperlipidemia, unspecified: Secondary | ICD-10-CM | POA: Diagnosis not present

## 2015-05-05 DIAGNOSIS — I5042 Chronic combined systolic (congestive) and diastolic (congestive) heart failure: Secondary | ICD-10-CM | POA: Diagnosis not present

## 2015-05-05 DIAGNOSIS — Z1389 Encounter for screening for other disorder: Secondary | ICD-10-CM | POA: Diagnosis not present

## 2015-05-05 DIAGNOSIS — Z6820 Body mass index (BMI) 20.0-20.9, adult: Secondary | ICD-10-CM | POA: Diagnosis not present

## 2015-05-05 DIAGNOSIS — M05741 Rheumatoid arthritis with rheumatoid factor of right hand without organ or systems involvement: Secondary | ICD-10-CM | POA: Diagnosis not present

## 2015-05-08 DIAGNOSIS — Z9181 History of falling: Secondary | ICD-10-CM | POA: Diagnosis not present

## 2015-05-08 DIAGNOSIS — Z7952 Long term (current) use of systemic steroids: Secondary | ICD-10-CM | POA: Diagnosis not present

## 2015-05-08 DIAGNOSIS — Z7982 Long term (current) use of aspirin: Secondary | ICD-10-CM | POA: Diagnosis not present

## 2015-05-08 DIAGNOSIS — I110 Hypertensive heart disease with heart failure: Secondary | ICD-10-CM | POA: Diagnosis not present

## 2015-05-08 DIAGNOSIS — I5041 Acute combined systolic (congestive) and diastolic (congestive) heart failure: Secondary | ICD-10-CM | POA: Diagnosis not present

## 2015-05-08 DIAGNOSIS — J441 Chronic obstructive pulmonary disease with (acute) exacerbation: Secondary | ICD-10-CM | POA: Diagnosis not present

## 2015-05-14 DIAGNOSIS — I5041 Acute combined systolic (congestive) and diastolic (congestive) heart failure: Secondary | ICD-10-CM | POA: Diagnosis not present

## 2015-05-14 DIAGNOSIS — Z9181 History of falling: Secondary | ICD-10-CM | POA: Diagnosis not present

## 2015-05-14 DIAGNOSIS — Z7982 Long term (current) use of aspirin: Secondary | ICD-10-CM | POA: Diagnosis not present

## 2015-05-14 DIAGNOSIS — Z7952 Long term (current) use of systemic steroids: Secondary | ICD-10-CM | POA: Diagnosis not present

## 2015-05-14 DIAGNOSIS — I110 Hypertensive heart disease with heart failure: Secondary | ICD-10-CM | POA: Diagnosis not present

## 2015-05-14 DIAGNOSIS — J441 Chronic obstructive pulmonary disease with (acute) exacerbation: Secondary | ICD-10-CM | POA: Diagnosis not present

## 2015-05-28 DIAGNOSIS — M810 Age-related osteoporosis without current pathological fracture: Secondary | ICD-10-CM | POA: Diagnosis not present

## 2015-05-28 DIAGNOSIS — Z79899 Other long term (current) drug therapy: Secondary | ICD-10-CM | POA: Diagnosis not present

## 2015-05-28 DIAGNOSIS — M2550 Pain in unspecified joint: Secondary | ICD-10-CM | POA: Diagnosis not present

## 2015-05-28 DIAGNOSIS — M0589 Other rheumatoid arthritis with rheumatoid factor of multiple sites: Secondary | ICD-10-CM | POA: Diagnosis not present

## 2015-06-05 DIAGNOSIS — R6889 Other general symptoms and signs: Secondary | ICD-10-CM | POA: Diagnosis not present

## 2015-06-05 DIAGNOSIS — Z6820 Body mass index (BMI) 20.0-20.9, adult: Secondary | ICD-10-CM | POA: Diagnosis not present

## 2015-08-11 DIAGNOSIS — Z681 Body mass index (BMI) 19 or less, adult: Secondary | ICD-10-CM | POA: Diagnosis not present

## 2015-08-11 DIAGNOSIS — I5042 Chronic combined systolic (congestive) and diastolic (congestive) heart failure: Secondary | ICD-10-CM | POA: Diagnosis not present

## 2015-08-11 DIAGNOSIS — M5137 Other intervertebral disc degeneration, lumbosacral region: Secondary | ICD-10-CM | POA: Diagnosis not present

## 2015-08-11 DIAGNOSIS — E785 Hyperlipidemia, unspecified: Secondary | ICD-10-CM | POA: Diagnosis not present

## 2015-08-11 DIAGNOSIS — M05741 Rheumatoid arthritis with rheumatoid factor of right hand without organ or systems involvement: Secondary | ICD-10-CM | POA: Diagnosis not present

## 2015-08-11 DIAGNOSIS — D509 Iron deficiency anemia, unspecified: Secondary | ICD-10-CM | POA: Diagnosis not present

## 2015-08-27 DIAGNOSIS — Z79899 Other long term (current) drug therapy: Secondary | ICD-10-CM | POA: Diagnosis not present

## 2015-08-27 DIAGNOSIS — M810 Age-related osteoporosis without current pathological fracture: Secondary | ICD-10-CM | POA: Diagnosis not present

## 2015-08-27 DIAGNOSIS — M2550 Pain in unspecified joint: Secondary | ICD-10-CM | POA: Diagnosis not present

## 2015-08-27 DIAGNOSIS — M0589 Other rheumatoid arthritis with rheumatoid factor of multiple sites: Secondary | ICD-10-CM | POA: Diagnosis not present

## 2015-08-31 DIAGNOSIS — I340 Nonrheumatic mitral (valve) insufficiency: Secondary | ICD-10-CM | POA: Insufficient documentation

## 2015-08-31 DIAGNOSIS — I351 Nonrheumatic aortic (valve) insufficiency: Secondary | ICD-10-CM | POA: Insufficient documentation

## 2015-08-31 DIAGNOSIS — I110 Hypertensive heart disease with heart failure: Secondary | ICD-10-CM | POA: Insufficient documentation

## 2015-08-31 DIAGNOSIS — I5042 Chronic combined systolic (congestive) and diastolic (congestive) heart failure: Secondary | ICD-10-CM | POA: Insufficient documentation

## 2015-08-31 HISTORY — DX: Nonrheumatic aortic (valve) insufficiency: I35.1

## 2015-08-31 HISTORY — DX: Hypertensive heart disease with heart failure: I11.0

## 2015-08-31 HISTORY — DX: Nonrheumatic mitral (valve) insufficiency: I34.0

## 2015-09-01 DIAGNOSIS — I110 Hypertensive heart disease with heart failure: Secondary | ICD-10-CM | POA: Diagnosis not present

## 2015-09-01 DIAGNOSIS — I5042 Chronic combined systolic (congestive) and diastolic (congestive) heart failure: Secondary | ICD-10-CM | POA: Diagnosis not present

## 2015-09-05 DIAGNOSIS — M810 Age-related osteoporosis without current pathological fracture: Secondary | ICD-10-CM | POA: Diagnosis not present

## 2015-09-18 ENCOUNTER — Ambulatory Visit (INDEPENDENT_AMBULATORY_CARE_PROVIDER_SITE_OTHER): Payer: Medicare Other | Admitting: Sports Medicine

## 2015-09-18 ENCOUNTER — Encounter: Payer: Self-pay | Admitting: Sports Medicine

## 2015-09-18 DIAGNOSIS — M2040 Other hammer toe(s) (acquired), unspecified foot: Secondary | ICD-10-CM

## 2015-09-18 DIAGNOSIS — L84 Corns and callosities: Secondary | ICD-10-CM | POA: Diagnosis not present

## 2015-09-18 DIAGNOSIS — I739 Peripheral vascular disease, unspecified: Secondary | ICD-10-CM | POA: Diagnosis not present

## 2015-09-18 DIAGNOSIS — M21619 Bunion of unspecified foot: Secondary | ICD-10-CM | POA: Diagnosis not present

## 2015-09-18 NOTE — Progress Notes (Signed)
Patient ID: [name] Parsons, female   DOB: 1919/03/13, 82 y.o.   MRN: [id number] Subjective: [name] Parsons is a 82 y.o. female patient who presents to office for evaluation of left greater than right foot pain secondary to callus skin. Patient complains of pain at the lesion present Left foot underneath big toe joint. Patient is assisted by daughter this visit, who states that she has not tried anything. Patient denies any other pedal complaints.   Patient Active Problem List   Diagnosis Date Noted  . Chronic combined systolic and diastolic heart failure ([place]) 08/31/2015  . Hypertensive heart disease with heart failure ([place]) 08/31/2015  . Non-rheumatic mitral regurgitation 08/31/2015  . AI (aortic incompetence) 08/31/2015  . Cryptococcosis ([place]) 04/06/2012  . Multiple pulmonary nodules 03/27/2012  . Hiatal hernia   . Rheumatoid arthritis(714.0)   . Hypertension   . Hyperlipidemia   . Osteoporosis   . CAD (coronary artery disease)   . Congestive heart failure [facility])     Current Outpatient Prescriptions on File Prior to Visit  Medication Sig Dispense Refill  . alendronate (FOSAMAX) 70 MG tablet Take 70 mg by mouth every 7 (seven) days.     Marland Kitchen atorvastatin (LIPITOR) 20 MG tablet Take 1 tablet (20 mg total) by mouth daily. 30 tablet 11  . bethanechol (URECHOLINE) 25 MG tablet Take 25 mg by mouth 3 (three) times daily.    . Calcium-Vitamin D 600-125 MG-UNIT TABS Take by mouth daily.    . cyclobenzaprine (FLEXERIL) 10 MG tablet Take 10 mg by mouth at bedtime.     . ferrous gluconate (FERGON) 325 MG tablet Take 325 mg by mouth daily with breakfast.     . folic acid (FOLVITE) 1 MG tablet Take 1 mg by mouth daily.    . furosemide (LASIX) 40 MG tablet Take 40 mg by mouth 2 (two) times daily.    Marland Kitchen HYDROcodone-acetaminophen (NORCO) 10-325 MG per tablet Take 1 tablet by mouth every 6 (six) hours as needed.    Marland Kitchen Lysine HCl 500 MG TABS Take by mouth daily.    . methotrexate (RHEUMATREX) 2.5 MG tablet  Take 8 mg by mouth once a week.     . Omega-3 Fatty Acids (FISH OIL) 1000 MG CAPS Take by mouth daily.    Marland Kitchen omeprazole (PRILOSEC) 20 MG capsule Take 20 mg by mouth daily.    . polyethylene glycol powder (CLEARLAX) powder Take 1 Container by mouth daily as needed.    . potassium chloride SA (K-DUR,KLOR-CON) 20 MEQ tablet Take 20 mEq by mouth daily.     . predniSONE (DELTASONE) 5 MG tablet Take 5 mg by mouth daily.     . ranitidine (ZANTAC) 150 MG tablet Take 150 mg by mouth 2 (two) times daily.    . Tamsulosin HCl (FLOMAX) 0.4 MG CAPS Take 0.4 mg by mouth daily.    . vitamin E 400 UNIT capsule Take 400 Units by mouth daily.     No current facility-administered medications on file prior to visit.    Allergies  Allergen Reactions  . Iodinated Diagnostic Agents   . Iohexol      Code: HIVES, Desc: PT DEVELOPED HIVES AFTER GETTING CONTRAST MEDIA;THIS OCCURED "SEVERAL" YRS. AGO.CT NOTIFIED 03/05/09!, Onset Date: [id number]     Objective:  General: Alert and oriented x3 in no acute distress  Dermatology: Keratotic lesion present Sub-met 1 left greater than right with skin lines transversing the lesion, pain is present with direct pressure to the lesion  with a central nucleated core noted, no webspace macerations, no ecchymosis bilateral, all nails x 10 are short and thickened.  Vascular: Dorsalis Pedis and Posterior Tibial pedal pulses 1/4, Capillary Fill Time 5 seconds, - pedal hair growth bilateral, no edema bilateral lower extremities, mild varicosities bilateral, Temperature gradient within normal limits.  Neurology: Johney Maine sensation intact via light touch bilateral.  Musculoskeletal: Mild tenderness with palpation at the keratotic lesion site on left, Muscular strength 4/5 in all groups without pain or limitation on range of motion. If it can't bunion and hammertoes deformities, with fat pad atrophy noted bilateral.  Assessment and Plan: Problem List Items Addressed This Visit    None     Visit Diagnoses    Callus of foot    -  Primary    Bunion        Hammer toe, unspecified laterality        PVD (peripheral vascular disease) ([place])          -Complete examination performed -Discussed treatment options -Parred keratoic lesionSub-met 1 on left using a chisel blade; treated the area withSalinocaine covered with moleskin -Gave foam toe pads and instructed on use -Encouraged daily skin emollients -Encouraged use of pumice stone -Advised good supportive shoes and inserts -Patient to return to office as needed or sooner if condition worsens.  Landis Martins, DPM

## 2015-11-17 DIAGNOSIS — R109 Unspecified abdominal pain: Secondary | ICD-10-CM | POA: Diagnosis not present

## 2015-11-17 DIAGNOSIS — M05741 Rheumatoid arthritis with rheumatoid factor of right hand without organ or systems involvement: Secondary | ICD-10-CM | POA: Diagnosis not present

## 2015-11-17 DIAGNOSIS — D509 Iron deficiency anemia, unspecified: Secondary | ICD-10-CM | POA: Diagnosis not present

## 2015-11-17 DIAGNOSIS — Z9181 History of falling: Secondary | ICD-10-CM | POA: Diagnosis not present

## 2015-11-17 DIAGNOSIS — I5042 Chronic combined systolic (congestive) and diastolic (congestive) heart failure: Secondary | ICD-10-CM | POA: Diagnosis not present

## 2015-11-17 DIAGNOSIS — E785 Hyperlipidemia, unspecified: Secondary | ICD-10-CM | POA: Diagnosis not present

## 2015-11-17 DIAGNOSIS — K5641 Fecal impaction: Secondary | ICD-10-CM | POA: Diagnosis not present

## 2015-11-17 DIAGNOSIS — M5137 Other intervertebral disc degeneration, lumbosacral region: Secondary | ICD-10-CM | POA: Diagnosis not present

## 2015-11-17 DIAGNOSIS — Z681 Body mass index (BMI) 19 or less, adult: Secondary | ICD-10-CM | POA: Diagnosis not present

## 2016-01-08 DIAGNOSIS — R51 Headache: Secondary | ICD-10-CM | POA: Diagnosis not present

## 2016-01-08 DIAGNOSIS — S199XXA Unspecified injury of neck, initial encounter: Secondary | ICD-10-CM | POA: Diagnosis not present

## 2016-01-08 DIAGNOSIS — S069X9A Unspecified intracranial injury with loss of consciousness of unspecified duration, initial encounter: Secondary | ICD-10-CM | POA: Diagnosis not present

## 2016-01-09 DIAGNOSIS — Z23 Encounter for immunization: Secondary | ICD-10-CM | POA: Diagnosis not present

## 2016-02-18 DIAGNOSIS — I5042 Chronic combined systolic (congestive) and diastolic (congestive) heart failure: Secondary | ICD-10-CM | POA: Diagnosis not present

## 2016-02-18 DIAGNOSIS — E785 Hyperlipidemia, unspecified: Secondary | ICD-10-CM | POA: Diagnosis not present

## 2016-02-18 DIAGNOSIS — Z681 Body mass index (BMI) 19 or less, adult: Secondary | ICD-10-CM | POA: Diagnosis not present

## 2016-02-18 DIAGNOSIS — M5137 Other intervertebral disc degeneration, lumbosacral region: Secondary | ICD-10-CM | POA: Diagnosis not present

## 2016-02-18 DIAGNOSIS — D509 Iron deficiency anemia, unspecified: Secondary | ICD-10-CM | POA: Diagnosis not present

## 2016-02-18 DIAGNOSIS — M05741 Rheumatoid arthritis with rheumatoid factor of right hand without organ or systems involvement: Secondary | ICD-10-CM | POA: Diagnosis not present

## 2016-02-26 DIAGNOSIS — M810 Age-related osteoporosis without current pathological fracture: Secondary | ICD-10-CM | POA: Diagnosis not present

## 2016-02-26 DIAGNOSIS — M2550 Pain in unspecified joint: Secondary | ICD-10-CM | POA: Diagnosis not present

## 2016-02-26 DIAGNOSIS — Z6820 Body mass index (BMI) 20.0-20.9, adult: Secondary | ICD-10-CM | POA: Diagnosis not present

## 2016-02-26 DIAGNOSIS — Z79899 Other long term (current) drug therapy: Secondary | ICD-10-CM | POA: Diagnosis not present

## 2016-02-26 DIAGNOSIS — M0589 Other rheumatoid arthritis with rheumatoid factor of multiple sites: Secondary | ICD-10-CM | POA: Diagnosis not present

## 2016-03-04 DIAGNOSIS — I110 Hypertensive heart disease with heart failure: Secondary | ICD-10-CM | POA: Diagnosis not present

## 2016-03-04 DIAGNOSIS — E785 Hyperlipidemia, unspecified: Secondary | ICD-10-CM | POA: Diagnosis not present

## 2016-03-04 DIAGNOSIS — I5032 Chronic diastolic (congestive) heart failure: Secondary | ICD-10-CM | POA: Diagnosis not present

## 2016-03-09 DIAGNOSIS — M810 Age-related osteoporosis without current pathological fracture: Secondary | ICD-10-CM | POA: Diagnosis not present

## 2016-03-18 DIAGNOSIS — L578 Other skin changes due to chronic exposure to nonionizing radiation: Secondary | ICD-10-CM | POA: Diagnosis not present

## 2016-03-18 DIAGNOSIS — L570 Actinic keratosis: Secondary | ICD-10-CM | POA: Diagnosis not present

## 2016-03-18 DIAGNOSIS — C44321 Squamous cell carcinoma of skin of nose: Secondary | ICD-10-CM | POA: Diagnosis not present

## 2016-05-19 DIAGNOSIS — I5042 Chronic combined systolic (congestive) and diastolic (congestive) heart failure: Secondary | ICD-10-CM | POA: Diagnosis not present

## 2016-05-19 DIAGNOSIS — Z681 Body mass index (BMI) 19 or less, adult: Secondary | ICD-10-CM | POA: Diagnosis not present

## 2016-05-19 DIAGNOSIS — E785 Hyperlipidemia, unspecified: Secondary | ICD-10-CM | POA: Diagnosis not present

## 2016-05-19 DIAGNOSIS — S32010A Wedge compression fracture of first lumbar vertebra, initial encounter for closed fracture: Secondary | ICD-10-CM | POA: Diagnosis not present

## 2016-05-19 DIAGNOSIS — K219 Gastro-esophageal reflux disease without esophagitis: Secondary | ICD-10-CM | POA: Diagnosis not present

## 2016-05-19 DIAGNOSIS — M5137 Other intervertebral disc degeneration, lumbosacral region: Secondary | ICD-10-CM | POA: Diagnosis not present

## 2016-05-19 DIAGNOSIS — D509 Iron deficiency anemia, unspecified: Secondary | ICD-10-CM | POA: Diagnosis not present

## 2016-05-19 DIAGNOSIS — M05741 Rheumatoid arthritis with rheumatoid factor of right hand without organ or systems involvement: Secondary | ICD-10-CM | POA: Diagnosis not present

## 2016-05-19 DIAGNOSIS — M47817 Spondylosis without myelopathy or radiculopathy, lumbosacral region: Secondary | ICD-10-CM | POA: Diagnosis not present

## 2016-05-19 DIAGNOSIS — N959 Unspecified menopausal and perimenopausal disorder: Secondary | ICD-10-CM | POA: Diagnosis not present

## 2016-05-25 DIAGNOSIS — Z136 Encounter for screening for cardiovascular disorders: Secondary | ICD-10-CM | POA: Diagnosis not present

## 2016-05-25 DIAGNOSIS — Z1389 Encounter for screening for other disorder: Secondary | ICD-10-CM | POA: Diagnosis not present

## 2016-05-25 DIAGNOSIS — Z0000 Encounter for general adult medical examination without abnormal findings: Secondary | ICD-10-CM | POA: Diagnosis not present

## 2016-05-25 DIAGNOSIS — Z9181 History of falling: Secondary | ICD-10-CM | POA: Diagnosis not present

## 2016-05-25 DIAGNOSIS — E785 Hyperlipidemia, unspecified: Secondary | ICD-10-CM | POA: Diagnosis not present

## 2016-06-21 DIAGNOSIS — N959 Unspecified menopausal and perimenopausal disorder: Secondary | ICD-10-CM | POA: Diagnosis not present

## 2016-06-21 DIAGNOSIS — M810 Age-related osteoporosis without current pathological fracture: Secondary | ICD-10-CM | POA: Diagnosis not present

## 2016-08-16 DIAGNOSIS — R42 Dizziness and giddiness: Secondary | ICD-10-CM | POA: Diagnosis not present

## 2016-08-16 DIAGNOSIS — Z6820 Body mass index (BMI) 20.0-20.9, adult: Secondary | ICD-10-CM | POA: Diagnosis not present

## 2016-08-16 DIAGNOSIS — H6123 Impacted cerumen, bilateral: Secondary | ICD-10-CM | POA: Diagnosis not present

## 2016-08-25 DIAGNOSIS — D509 Iron deficiency anemia, unspecified: Secondary | ICD-10-CM | POA: Diagnosis not present

## 2016-08-25 DIAGNOSIS — M47817 Spondylosis without myelopathy or radiculopathy, lumbosacral region: Secondary | ICD-10-CM | POA: Diagnosis not present

## 2016-08-25 DIAGNOSIS — I5042 Chronic combined systolic (congestive) and diastolic (congestive) heart failure: Secondary | ICD-10-CM | POA: Diagnosis not present

## 2016-08-25 DIAGNOSIS — Z6820 Body mass index (BMI) 20.0-20.9, adult: Secondary | ICD-10-CM | POA: Diagnosis not present

## 2016-08-25 DIAGNOSIS — K219 Gastro-esophageal reflux disease without esophagitis: Secondary | ICD-10-CM | POA: Diagnosis not present

## 2016-08-25 DIAGNOSIS — M05741 Rheumatoid arthritis with rheumatoid factor of right hand without organ or systems involvement: Secondary | ICD-10-CM | POA: Diagnosis not present

## 2016-08-25 DIAGNOSIS — E785 Hyperlipidemia, unspecified: Secondary | ICD-10-CM | POA: Diagnosis not present

## 2016-08-26 DIAGNOSIS — Z6820 Body mass index (BMI) 20.0-20.9, adult: Secondary | ICD-10-CM | POA: Diagnosis not present

## 2016-08-26 DIAGNOSIS — Z79899 Other long term (current) drug therapy: Secondary | ICD-10-CM | POA: Diagnosis not present

## 2016-08-26 DIAGNOSIS — M810 Age-related osteoporosis without current pathological fracture: Secondary | ICD-10-CM | POA: Diagnosis not present

## 2016-08-26 DIAGNOSIS — M2550 Pain in unspecified joint: Secondary | ICD-10-CM | POA: Diagnosis not present

## 2016-08-26 DIAGNOSIS — M0589 Other rheumatoid arthritis with rheumatoid factor of multiple sites: Secondary | ICD-10-CM | POA: Diagnosis not present

## 2016-09-02 ENCOUNTER — Telehealth: Payer: Self-pay | Admitting: Cardiology

## 2016-09-02 NOTE — Telephone Encounter (Signed)
Closed encounter °

## 2016-09-07 DIAGNOSIS — M810 Age-related osteoporosis without current pathological fracture: Secondary | ICD-10-CM | POA: Diagnosis not present

## 2016-09-27 ENCOUNTER — Ambulatory Visit: Payer: Medicare Other | Admitting: Cardiology

## 2016-10-12 ENCOUNTER — Ambulatory Visit: Payer: Medicare Other | Admitting: Cardiology

## 2016-10-19 ENCOUNTER — Encounter: Payer: Self-pay | Admitting: Cardiology

## 2016-10-19 ENCOUNTER — Ambulatory Visit (INDEPENDENT_AMBULATORY_CARE_PROVIDER_SITE_OTHER): Payer: Medicare Other | Admitting: Cardiology

## 2016-10-19 VITALS — BP 102/60 | HR 68 | Ht 59.0 in | Wt 99.0 lb

## 2016-10-19 DIAGNOSIS — I5032 Chronic diastolic (congestive) heart failure: Secondary | ICD-10-CM | POA: Diagnosis not present

## 2016-10-19 DIAGNOSIS — I447 Left bundle-branch block, unspecified: Secondary | ICD-10-CM | POA: Diagnosis not present

## 2016-10-19 DIAGNOSIS — I110 Hypertensive heart disease with heart failure: Secondary | ICD-10-CM

## 2016-10-19 NOTE — Progress Notes (Signed)
Cardiology Office Note:    Date:  10/19/2016   ID:  MELAYAH SKORUPSKI, DOB 09-12-1919, MRN [id number]  PCP:  Nicoletta Dress, MD  Cardiologist:  Shirlee More, MD    Referring MD: Nicoletta Dress, MD    ASSESSMENT:    1. Chronic diastolic congestive heart failure ([place])   2. Hypertensive heart disease with heart failure ([place])   3. LBBB (left bundle branch block)    PLAN:    In order of problems listed above:  1. Stable compensated continue close supervision sodium restriction and current diuretic all 2. Stable compensated blood pressure at target with diuretic all   Next appointment: 6 months    Medication Adjustments/Labs and Tests Ordered: Current medicines are reviewed at length with the patient today.  Concerns regarding medicines are outlined above.  No orders of the defined types were placed in this encounter.  No orders of the defined types were placed in this encounter.   Chief Complaint  Patient presents with  . Follow-up    Routine flup appt     History of Present Illness:    KABREA SEENEY is a 83 y.o. female with a hx of CHF, Dyslipidemia, HTN last seen in December 2017.She is closely supervised by her daughter weight stable short of breath when she walks a longer distance leaving the home coming to the doctor point the market but otherwise has no edema shortness of breath orthopnea chest pain palpitation or syncope. She is pleased with quality of her life Compliance with diet, lifestyle and medications: Yes Past Medical History:  Diagnosis Date  . AI (aortic incompetence) 08/31/2015   Overview:  mild to mod   . Anemia, iron deficiency   . CAD (coronary artery disease)   . Congestive heart failure ([place])   . Depression   . Diastolic heart failure ([place])   . GERD (gastroesophageal reflux disease)   . Hiatal hernia   . Hx of colonic polyps   . Hyperlipidemia   . Hypertension   . Hypertensive heart disease with heart failure ([place]) 08/31/2015  .  Hyponatremia   . Hypopotassemia   . Hypopotassemia   . Hypotension   . Multiple pulmonary nodules 03/27/2012  . Non-rheumatic mitral regurgitation 08/31/2015   Overview:  Mild   . Osteoarthrosis, hand   . Osteoporosis   . Rheumatoid arthritis(714.0)   . Spondylosis, lumbosacral     Past Surgical History:  Procedure Laterality Date  . APPENDECTOMY    . CATARACT EXTRACTION     insert prosthetic lens  . CHOLECYSTECTOMY    . hyperplastic polyp removed  201   Dr Lyda Jester  . LAMINECTOMY     lumber with fusion  . spinal stenosis surgery  2004  . TOTAL ABDOMINAL HYSTERECTOMY      Current Medications: Current Meds  Medication Sig  . aspirin EC 81 MG tablet Take 81 mg by mouth daily.  Marland Kitchen atorvastatin (LIPITOR) 20 MG tablet Take 1 tablet (20 mg total) by mouth daily.  . bethanechol (URECHOLINE) 25 MG tablet Take 25 mg by mouth 3 (three) times daily.  . Calcium-Vitamin D 600-125 MG-UNIT TABS Take by mouth daily.  . cyclobenzaprine (FLEXERIL) 10 MG tablet Take 10 mg by mouth at bedtime.   Marland Kitchen denosumab (PROLIA) 60 MG/ML SOLN injection Inject 60 mg into the skin every 6 (six) months.  . ferrous gluconate (FERGON) 325 MG tablet Take 325 mg by mouth daily with breakfast.   . folic acid (FOLVITE) 1 MG  tablet Take 1 mg by mouth daily.  . furosemide (LASIX) 40 MG tablet Take 40 mg by mouth daily. Takes 80 mg 2 - 3 times per week  . HYDROcodone-acetaminophen (NORCO) 10-325 MG per tablet Take 1 tablet by mouth every 6 (six) hours as needed.  Marland Kitchen Lysine HCl 500 MG TABS Take by mouth daily.  . methotrexate (RHEUMATREX) 2.5 MG tablet Take 8 mg by mouth once a week.   . Omega-3 Fatty Acids (FISH OIL) 1000 MG CAPS Take by mouth daily.  Marland Kitchen omeprazole (PRILOSEC) 20 MG capsule Take 20 mg by mouth daily.  . polyethylene glycol powder (CLEARLAX) powder Take 1 Container by mouth daily as needed.  . potassium chloride SA (K-DUR,KLOR-CON) 20 MEQ tablet Take 20 mEq by mouth daily.   . predniSONE (DELTASONE) 5  MG tablet Take 5 mg by mouth daily.   . ranitidine (ZANTAC) 150 MG tablet Take 150 mg by mouth 2 (two) times daily.  . vitamin E 400 UNIT capsule Take 400 Units by mouth daily.     Allergies:   Iodinated diagnostic agents and Iohexol   Social History   Social History  . Marital status: Widowed    Spouse name: N/A  . Number of children: N/A  . Years of education: N/A   Occupational History  . retired    Social History Main Topics  . Smoking status: Never Smoker  . Smokeless tobacco: Never Used  . Alcohol use No  . Drug use: No  . Sexual activity: Not Asked   Other Topics Concern  . None   Social History Narrative  . None     Family History: The patient's family history includes Diabetes in her maternal grandfather, mother, and sister; Heart failure in her brother. ROS:   Please see the history of present illness.    All other systems reviewed and are negative.  EKGs/Labs/Other Studies Reviewed:    The following studies were reviewed today  Recent Labs: 08/25/16 Cbc and CMP normal No results found for requested labs within last 8760 hours.  Recent Lipid Panel Chol 144, LDL 61  No results found for: CHOL, TRIG, HDL, CHOLHDL, VLDL, LDLCALC, LDLDIRECT  Physical Exam:    VS:  BP 102/60 (BP Location: Right Arm, Patient Position: Sitting)   Pulse 68   Ht 4\' 11"  (1.499 m)   Wt 99 lb (44.9 kg)   SpO2 91%   BMI 20.00 kg/m     Wt Readings from Last 3 Encounters:  10/19/16 99 lb (44.9 kg)  08/20/13 115 lb (52.2 kg)  04/19/13 101 lb (45.8 kg)     GEN: She appears frail  And is  in no acute distress HEENT: Normal NECK: No JVD; No carotid bruits LYMPHATICS: No lymphadenopathy CARDIAC: Grade 2/6 aortic outflow murmur S2 normal.RRR, , rubs, gallops RESPIRATORY:  Clear to auscultation without rales, wheezing or rhonchi  ABDOMEN: Soft, non-tender, non-distended MUSCULOSKELETAL:  No edema; No deformity  SKIN: Warm and dry NEUROLOGIC:  Alert and oriented x  3 PSYCHIATRIC:  Normal affect    Signed, Shirlee More, MD  10/19/2016 2:53 PM    [place]

## 2016-10-19 NOTE — Patient Instructions (Addendum)
Medication Instructions:  Your physician recommends that you continue on your current medications as directed. Please refer to the Current Medication list given to you today.   Labwork: None  Testing/Procedures: None  Follow-Up: Your physician wants you to follow-up in: 6 months. You will receive a reminder letter in the mail two months in advance. If you don't receive a letter, please call our office to schedule the follow-up appointment.   Any Other Special Instructions Will Be Listed Below (If Applicable).     If you need a refill on your cardiac medications before your next appointment, please call your pharmacy.    Heart Failure  Weigh yourself every morning when you first wake up and record on a calender or note pad, bring this to your office visits. Using a pill tender can help with taking your medications consistently.  Limit your fluid intake to 2 liters daily  Limit your sodium intake to less than 2-3 grams daily. Ask if you need dietary teaching.  If you gain more than 3 pounds (from your dry weight ), double your dose of diuretic for the day.  If you gain more than 5 pounds (from your dry weight), double your dose of lasix and call your heart failure doctor.  Please do not smoke tobacco since it is very bad for your heart.  Please do not drink alcohol since it can worsen your heart failure.Also avoid OTC nonsteroidal drugs, such as advil, aleve and motrin.  Try to exercise for at least 30 minutes every day because this will help your heart be more efficient. You may be eligible for supervised cardiac rehab, ask your physician.     

## 2016-11-12 DIAGNOSIS — S51812A Laceration without foreign body of left forearm, initial encounter: Secondary | ICD-10-CM | POA: Diagnosis not present

## 2016-11-12 DIAGNOSIS — S0001XA Abrasion of scalp, initial encounter: Secondary | ICD-10-CM | POA: Diagnosis not present

## 2016-11-12 DIAGNOSIS — S51802A Unspecified open wound of left forearm, initial encounter: Secondary | ICD-10-CM | POA: Diagnosis not present

## 2016-11-12 DIAGNOSIS — S0990XA Unspecified injury of head, initial encounter: Secondary | ICD-10-CM | POA: Diagnosis not present

## 2016-11-12 DIAGNOSIS — Z23 Encounter for immunization: Secondary | ICD-10-CM | POA: Diagnosis not present

## 2016-11-12 DIAGNOSIS — S0081XA Abrasion of other part of head, initial encounter: Secondary | ICD-10-CM | POA: Diagnosis not present

## 2016-11-12 DIAGNOSIS — S0003XA Contusion of scalp, initial encounter: Secondary | ICD-10-CM | POA: Diagnosis not present

## 2016-11-26 DIAGNOSIS — D509 Iron deficiency anemia, unspecified: Secondary | ICD-10-CM | POA: Diagnosis not present

## 2016-11-26 DIAGNOSIS — Z9181 History of falling: Secondary | ICD-10-CM | POA: Diagnosis not present

## 2016-11-26 DIAGNOSIS — Z681 Body mass index (BMI) 19 or less, adult: Secondary | ICD-10-CM | POA: Diagnosis not present

## 2016-11-26 DIAGNOSIS — E785 Hyperlipidemia, unspecified: Secondary | ICD-10-CM | POA: Diagnosis not present

## 2016-11-26 DIAGNOSIS — I5042 Chronic combined systolic (congestive) and diastolic (congestive) heart failure: Secondary | ICD-10-CM | POA: Diagnosis not present

## 2016-11-26 DIAGNOSIS — M47817 Spondylosis without myelopathy or radiculopathy, lumbosacral region: Secondary | ICD-10-CM | POA: Diagnosis not present

## 2016-11-26 DIAGNOSIS — K219 Gastro-esophageal reflux disease without esophagitis: Secondary | ICD-10-CM | POA: Diagnosis not present

## 2016-11-26 DIAGNOSIS — M05741 Rheumatoid arthritis with rheumatoid factor of right hand without organ or systems involvement: Secondary | ICD-10-CM | POA: Diagnosis not present

## 2017-01-05 DIAGNOSIS — Z23 Encounter for immunization: Secondary | ICD-10-CM | POA: Diagnosis not present

## 2017-02-25 DIAGNOSIS — M545 Low back pain: Secondary | ICD-10-CM | POA: Diagnosis not present

## 2017-02-25 DIAGNOSIS — Z681 Body mass index (BMI) 19 or less, adult: Secondary | ICD-10-CM | POA: Diagnosis not present

## 2017-02-25 DIAGNOSIS — Z79899 Other long term (current) drug therapy: Secondary | ICD-10-CM | POA: Diagnosis not present

## 2017-02-25 DIAGNOSIS — M0589 Other rheumatoid arthritis with rheumatoid factor of multiple sites: Secondary | ICD-10-CM | POA: Diagnosis not present

## 2017-02-25 DIAGNOSIS — M2550 Pain in unspecified joint: Secondary | ICD-10-CM | POA: Diagnosis not present

## 2017-03-03 DIAGNOSIS — D509 Iron deficiency anemia, unspecified: Secondary | ICD-10-CM | POA: Diagnosis not present

## 2017-03-03 DIAGNOSIS — I5042 Chronic combined systolic (congestive) and diastolic (congestive) heart failure: Secondary | ICD-10-CM | POA: Diagnosis not present

## 2017-03-03 DIAGNOSIS — Z681 Body mass index (BMI) 19 or less, adult: Secondary | ICD-10-CM | POA: Diagnosis not present

## 2017-03-03 DIAGNOSIS — M05741 Rheumatoid arthritis with rheumatoid factor of right hand without organ or systems involvement: Secondary | ICD-10-CM | POA: Diagnosis not present

## 2017-03-03 DIAGNOSIS — K219 Gastro-esophageal reflux disease without esophagitis: Secondary | ICD-10-CM | POA: Diagnosis not present

## 2017-03-03 DIAGNOSIS — E785 Hyperlipidemia, unspecified: Secondary | ICD-10-CM | POA: Diagnosis not present

## 2017-03-03 DIAGNOSIS — M47817 Spondylosis without myelopathy or radiculopathy, lumbosacral region: Secondary | ICD-10-CM | POA: Diagnosis not present

## 2017-03-11 DIAGNOSIS — M810 Age-related osteoporosis without current pathological fracture: Secondary | ICD-10-CM | POA: Diagnosis not present

## 2017-03-24 ENCOUNTER — Ambulatory Visit (INDEPENDENT_AMBULATORY_CARE_PROVIDER_SITE_OTHER): Payer: Medicare Other | Admitting: Sports Medicine

## 2017-03-24 ENCOUNTER — Encounter: Payer: Self-pay | Admitting: Sports Medicine

## 2017-03-24 DIAGNOSIS — M2040 Other hammer toe(s) (acquired), unspecified foot: Secondary | ICD-10-CM

## 2017-03-24 DIAGNOSIS — M79671 Pain in right foot: Secondary | ICD-10-CM | POA: Diagnosis not present

## 2017-03-24 DIAGNOSIS — L84 Corns and callosities: Secondary | ICD-10-CM | POA: Diagnosis not present

## 2017-03-24 DIAGNOSIS — M21619 Bunion of unspecified foot: Secondary | ICD-10-CM

## 2017-03-24 DIAGNOSIS — L97211 Non-pressure chronic ulcer of right calf limited to breakdown of skin: Secondary | ICD-10-CM | POA: Diagnosis not present

## 2017-03-24 DIAGNOSIS — I739 Peripheral vascular disease, unspecified: Secondary | ICD-10-CM

## 2017-03-24 MED ORDER — AMOXICILLIN-POT CLAVULANATE 875-125 MG PO TABS: 1.0000 | ORAL_TABLET | Freq: Two times a day (BID) | ORAL | 0 refills | Status: DC

## 2017-03-24 NOTE — Progress Notes (Signed)
Patient ID: [name] Parsons, female   DOB: Jul 06, 1919, 84 y.o.   MRN: [id number] Subjective: [name] Parsons is a 84 y.o. female patient who presents to office for evaluation of now right greater than left foot pain secondary to callus skin. Patient complains of pain at the lesion present right foot underneath big toe joint. Patient is assisted by daughter this visit, who states that she put OTC callus remover on it and it became more sore. Denies constitutional symptoms. Patient denies any other pedal complaints.   Patient Active Problem List   Diagnosis Date Noted  . LBBB (left bundle branch block) 10/19/2016  . Hypertensive heart disease with heart failure ([place]) 08/31/2015  . Non-rheumatic mitral regurgitation 08/31/2015  . AI (aortic incompetence) 08/31/2015  . 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 Multiple pulmonary nodules 03/27/2012  . Hiatal hernia   . Rheumatoid arthritis(714.0)   . Hypertension   . Hyperlipidemia   . Osteoporosis   . CAD (coronary artery disease)   . Congestive heart failure ([place])     Current Outpatient Medications on File Prior to Visit  Medication Sig Dispense Refill  . aspirin EC 81 MG tablet Take 81 mg by mouth daily.    . bethanechol (URECHOLINE) 25 MG tablet Take 25 mg by mouth 3 (three) times daily.    . Calcium-Vitamin D 600-125 MG-UNIT TABS Take by mouth daily.    . cyclobenzaprine (FLEXERIL) 10 MG tablet Take 10 mg by mouth at bedtime.     Marland Kitchen denosumab (PROLIA) 60 MG/ML SOLN injection Inject 60 mg into the skin every 6 (six) months.    . ferrous gluconate (FERGON) 325 MG tablet Take 325 mg by mouth daily with breakfast.     . folic acid (FOLVITE) 1 MG tablet Take 1 mg by mouth daily.    . furosemide (LASIX) 40 MG tablet Take 40 mg by mouth daily. Takes 80 mg 2 - 3 times per week    . HYDROcodone-acetaminophen (NORCO) 10-325 MG per tablet Take 1 tablet by mouth every 6 (six) hours as needed.    . methotrexate (RHEUMATREX) 2.5 MG tablet Take 2.5 mg by mouth  as directed. Take 6 tablets weekly    . Omega-3 Fatty Acids (FISH OIL) 1000 MG CAPS Take by mouth daily.    Marland Kitchen omeprazole (PRILOSEC) 20 MG capsule Take 20 mg by mouth daily.    . polyethylene glycol powder (CLEARLAX) powder Take 1 Container by mouth daily as needed.    . potassium chloride SA (K-DUR,KLOR-CON) 20 MEQ tablet Take 20 mEq by mouth daily.     . predniSONE (DELTASONE) 5 MG tablet Take 5 mg by mouth daily.     . ranitidine (ZANTAC) 150 MG tablet Take 150 mg by mouth 2 (two) times daily.    . vitamin E 400 UNIT capsule Take 400 Units by mouth daily.    Marland Kitchen atorvastatin (LIPITOR) 20 MG tablet Take 1 tablet (20 mg total) by mouth daily. (Patient not taking: Reported on 03/24/2017) 30 tablet 11  . Lysine HCl 500 MG TABS Take by mouth daily.    . methotrexate (RHEUMATREX) 2.5 MG tablet Take 8 mg by mouth once a week.      No current facility-administered medications on file prior to visit.     Allergies  Allergen Reactions  . Iodinated Diagnostic Agents   . Iohexol      Code: HIVES, Desc: PT DEVELOPED HIVES AFTER GETTING CONTRAST MEDIA;THIS OCCURED "SEVERAL" YRS. AGO.CT NOTIFIED 03/05/09!, Onset Date: [id number]  Objective:  General: Alert and oriented x3 in no acute distress  Dermatology: Keratotic lesion present Sub-met 1bilateral. There is a small pinpoint ulceration with granular base to right sub met 1 callus with focal inflammation without warmth or any other active signs of infection. There is no drainage or no infection. Mild pain is present with direct pressure to right, no webspace macerations, no ecchymosis bilateral, all nails x 10 are short and thickened.  Vascular: Dorsalis Pedis and Posterior Tibial pedal pulses 1/4, Capillary Fill Time 5 seconds, - pedal hair growth bilateral, no edema bilateral lower extremities, mild varicosities bilateral, Temperature gradient within normal limits.  Neurology: Johney Maine sensation intact via light touch bilateral.  Musculoskeletal:  Mild tenderness with palpation on right foot at area of concern, Muscular strength 4/5 in all groups without pain or limitation on range of motion. + bunion and hammertoes deformities, with fat pad atrophy noted bilateral.  Assessment and Plan: Problem List Items Addressed This Visit    None    Visit Diagnoses    Skin ulcer of calf, right, limited to breakdown of skin (HCC)    -  Primary   Relevant Medications   amoxicillin-clavulanate (AUGMENTIN) 875-125 MG tablet   Callus of foot       Bunion       Hammer toe, unspecified laterality       PVD (peripheral vascular disease) ([place])       Right foot pain         -Complete examination performed -Discussed treatment options for now self induced pinpoint ulceration at right great toe  -Cleansed area and applied antibiotic cream and bandaid advised daughter to do the same daily until area is healed -Rx Augmentin for preventative measures  -May leave open to air at night -Continue with bunion padding -Advised to refrain from callus removers which was the cause of this  -Patient to return to office in no better within 2 weeks or sooner if condition worsens.  Landis Martins, DPM

## 2017-03-26 DIAGNOSIS — M2012 Hallux valgus (acquired), left foot: Secondary | ICD-10-CM | POA: Diagnosis not present

## 2017-03-26 DIAGNOSIS — M79671 Pain in right foot: Secondary | ICD-10-CM | POA: Diagnosis not present

## 2017-03-26 DIAGNOSIS — M2010 Hallux valgus (acquired), unspecified foot: Secondary | ICD-10-CM | POA: Diagnosis not present

## 2017-03-26 DIAGNOSIS — M2011 Hallux valgus (acquired), right foot: Secondary | ICD-10-CM | POA: Diagnosis not present

## 2017-04-14 DIAGNOSIS — C44321 Squamous cell carcinoma of skin of nose: Secondary | ICD-10-CM | POA: Diagnosis not present

## 2017-04-20 NOTE — Progress Notes (Signed)
Cardiology Office Note:    Date:  04/21/2017   ID:  [name] Parsons, DOB 07/05/1919, MRN [id number]  PCP:  Nicoletta Dress, MD  Cardiologist:  Shirlee More, MD    Referring MD: Nicoletta Dress, MD    ASSESSMENT:    1. Chronic diastolic heart failure ([place])   2. Hypertensive heart disease with heart failure ([place])   3. LBBB (left bundle branch block)    PLAN:    In order of problems listed above:  1. Compensated despite her low blood pressure we will continue her current dose of diuretic and close supervision by her daughter. 2. Stable continue current diuretic 3. Stable pattern on EKG low blood pressure unrelated to arrhythmia   Next appointment: Months 6   Medication Adjustments/Labs and Tests Ordered: Current medicines are reviewed at length with the patient today.  Concerns regarding medicines are outlined above.  Orders Placed This Encounter  Procedures  . EKG 12-Lead   No orders of the defined types were placed in this encounter.   Chief Complaint  Patient presents with  . Follow-up    6 month follow up     History of Present Illness:    [name] Parsons is a 84 y.o. female with a hx of CHF, Dyslipidemia, HTN  last seen 6 months ago.She has extensive facial skin cancer and will have XRT. Weight at home is stable 94 lbs . She had lost a few lbs with a poor appetite but has improved and is now stable. Her daughter adjusts diuretic if weight gain occurs. ASSESSMENT:    1. Chronic diastolic congestive heart failure ([place])   2. Hypertensive heart disease with heart failure ([place])   3. LBBB (left bundle branch block)    PLAN:    In order of problems listed above: 1. Stable compensated continue close supervision sodium restriction and current diuretic all 2. Stable compensated blood pressure at target with diuretic all  Compliance with diet, lifestyle and medications: Yes She has become very frail poor appetite lost 3-5 pounds and her weights have now  plateaued.  Her daughter cares for her supervises are administered meds and intermittently has to give her an extra dose of diuretic.  She is diffusely weak but not short of breath no chest pain syncope or TIA. Unfortunately she has an extensive nasal cancer and has been advised XRT.  Her blood pressure is 75-170 systolic and this is her clinical stable baseline. Past Medical History:  Diagnosis Date  . AI (aortic incompetence) 08/31/2015   Overview:  mild to mod   . Anemia, iron deficiency   . CAD (coronary artery disease)   . Congestive heart failure ([place])   . Depression   . Diastolic heart failure ([place])   . GERD (gastroesophageal reflux disease)   . Hiatal hernia   . Hx of colonic polyps   . Hyperlipidemia   . Hypertension   . Hypertensive heart disease with heart failure ([place]) 08/31/2015  . Hyponatremia   . Hypopotassemia   . Hypopotassemia   . Hypotension   . Multiple pulmonary nodules 03/27/2012  . Non-rheumatic mitral regurgitation 08/31/2015   Overview:  Mild   . Osteoarthrosis, hand   . Osteoporosis   . Rheumatoid arthritis(714.0)   . Spondylosis, lumbosacral     Past Surgical History:  Procedure Laterality Date  . APPENDECTOMY    . CATARACT EXTRACTION     insert prosthetic lens  . CHOLECYSTECTOMY    . hyperplastic polyp removed  201   Dr Lyda Jester  . LAMINECTOMY     lumber with fusion  . spinal stenosis surgery  2004  . TOTAL ABDOMINAL HYSTERECTOMY      Current Medications: Current Meds  Medication Sig  . aspirin EC 81 MG tablet Take 81 mg by mouth daily.  Marland Kitchen atorvastatin (LIPITOR) 20 MG tablet Take 1 tablet (20 mg total) by mouth daily.  . bethanechol (URECHOLINE) 25 MG tablet Take 25 mg by mouth 3 (three) times daily.  . Calcium-Vitamin D 600-125 MG-UNIT TABS Take by mouth daily.  . Cholecalciferol (VITAMIN D-3) 1000 units CAPS Take 1,000 Units by mouth daily.  . cyclobenzaprine (FLEXERIL) 10 MG tablet Take 10 mg by mouth at bedtime.   Marland Kitchen denosumab  (PROLIA) 60 MG/ML SOLN injection Inject 60 mg into the skin every 6 (six) months.  . ferrous gluconate (FERGON) 325 MG tablet Take 325 mg by mouth daily with breakfast.   . folic acid (FOLVITE) 1 MG tablet Take 1 mg by mouth daily.  . furosemide (LASIX) 40 MG tablet Take 40 mg by mouth daily. Takes 80 mg 2 - 3 times per week  . HYDROcodone-acetaminophen (NORCO) 10-325 MG per tablet Take 1 tablet by mouth every 6 (six) hours as needed.  Marland Kitchen Lysine HCl 500 MG TABS Take by mouth daily.  . methotrexate (RHEUMATREX) 2.5 MG tablet Take 2.5 mg by mouth as directed. Take 6 tablets weekly  . Omega-3 Fatty Acids (FISH OIL) 1000 MG CAPS Take by mouth daily.  Marland Kitchen omeprazole (PRILOSEC) 20 MG capsule Take 20 mg by mouth daily.  . polyethylene glycol powder (CLEARLAX) powder Take 1 Container by mouth daily as needed.  . potassium chloride SA (K-DUR,KLOR-CON) 20 MEQ tablet Take 20 mEq by mouth daily.   . predniSONE (DELTASONE) 5 MG tablet Take 5 mg by mouth daily.   . ranitidine (ZANTAC) 150 MG tablet Take 150 mg by mouth 2 (two) times daily.     Allergies:   Iodinated diagnostic agents and Iohexol   Social History   Socioeconomic History  . Marital status: Widowed    Spouse name: None  . Number of children: None  . Years of education: None  . Highest education level: None  Social Needs  . Financial resource strain: None  . Food insecurity - worry: None  . Food insecurity - inability: None  . Transportation needs - medical: None  . Transportation needs - non-medical: None  Occupational History  . Occupation: retired  Tobacco Use  . Smoking status: Never Smoker  . Smokeless tobacco: Never Used  Substance and Sexual Activity  . Alcohol use: No  . Drug use: No  . Sexual activity: None  Other Topics Concern  . None  Social History Narrative  . None     Family History: The patient's family history includes Diabetes in her maternal grandfather, mother, and sister; Heart failure in her  brother. ROS:   Please see the history of present illness.    All other systems reviewed and are negative.  EKGs/Labs/Other Studies Reviewed:    The following studies were reviewed today:  EKG:  EKG ordered today.  The ekg ordered today demonstrates sinus rhythm left bundle branch block stable  Recent Labs: No results found for requested labs within last 8760 hours.  Recent Lipid Panel No results found for: CHOL, TRIG, HDL, CHOLHDL, VLDL, LDLCALC, LDLDIRECT  Physical Exam:    VS:  BP (!) 96/56 (BP Location: Left Arm, Patient Position: Sitting, Cuff Size: Small)  Pulse 75   Ht 4\' 11"  (1.499 m)   Wt 95 lb (43.1 kg)   SpO2 98%   BMI 19.19 kg/m     Wt Readings from Last 3 Encounters:  04/21/17 95 lb (43.1 kg)  10/19/16 99 lb (44.9 kg)  08/20/13 115 lb (52.2 kg)     GEN: very frail in a wheelchair in no acute distress HEENT: Normal NECK: No JVD; No carotid bruits LYMPHATICS: No lymphadenopathy CARDIAC: paradoxical S2 RRR, no murmurs, rubs, gallops RESPIRATORY:  Clear to auscultation without rales, wheezing or rhonchi  ABDOMEN: Soft, non-tender, non-distended MUSCULOSKELETAL:  No edema; No deformity  SKIN: Warm and dry NEUROLOGIC:  Alert and oriented x 3 PSYCHIATRIC:  Normal affect    Signed, Shirlee More, MD  04/21/2017 1:03 PM    [facility]

## 2017-04-21 ENCOUNTER — Ambulatory Visit (INDEPENDENT_AMBULATORY_CARE_PROVIDER_SITE_OTHER): Payer: Medicare Other | Admitting: Cardiology

## 2017-04-21 ENCOUNTER — Encounter: Payer: Self-pay | Admitting: Cardiology

## 2017-04-21 VITALS — BP 96/56 | HR 75 | Ht 59.0 in | Wt 95.0 lb

## 2017-04-21 DIAGNOSIS — I5032 Chronic diastolic (congestive) heart failure: Secondary | ICD-10-CM

## 2017-04-21 DIAGNOSIS — I447 Left bundle-branch block, unspecified: Secondary | ICD-10-CM

## 2017-04-21 DIAGNOSIS — I110 Hypertensive heart disease with heart failure: Secondary | ICD-10-CM

## 2017-04-21 NOTE — Patient Instructions (Addendum)
Medication Instructions:  Your physician recommends that you continue on your current medications as directed. Please refer to the Current Medication list given to you today.  Labwork: None  Testing/Procedures: None  Follow-Up: Your physician recommends that you schedule a follow-up appointment in: 6 months  Any Other Special Instructions Will Be Listed Below (If Applicable).     If you need a refill on your cardiac medications before your next appointment, please call your pharmacy.   [place], RN, BSN  Heart Failure  Weigh yourself every morning when you first wake up and record on a calender or note pad, bring this to your office visits. Using a pill tender can help with taking your medications consistently.  Limit your fluid intake to 2 liters daily  Limit your sodium intake to less than 2-3 grams daily. Ask if you need dietary teaching.  If you gain more than 3 pounds (from your dry weight ), double your dose of diuretic for the day.  If you gain more than 5 pounds (from your dry weight), double your dose of lasix and call your heart failure doctor.  Please do not smoke tobacco since it is very bad for your heart.  Please do not drink alcohol since it can worsen your heart failure.Also avoid OTC nonsteroidal drugs, such as advil, aleve and motrin.  Try to exercise for at least 30 minutes every day because this will help your heart be more efficient. You may be eligible for supervised cardiac rehab, ask your physician.

## 2017-04-22 DIAGNOSIS — C44301 Unspecified malignant neoplasm of skin of nose: Secondary | ICD-10-CM | POA: Diagnosis not present

## 2017-04-22 DIAGNOSIS — C44321 Squamous cell carcinoma of skin of nose: Secondary | ICD-10-CM | POA: Diagnosis not present

## 2017-04-27 DIAGNOSIS — C44301 Unspecified malignant neoplasm of skin of nose: Secondary | ICD-10-CM | POA: Diagnosis not present

## 2017-04-27 DIAGNOSIS — Z510 Encounter for antineoplastic radiation therapy: Secondary | ICD-10-CM | POA: Diagnosis not present

## 2017-05-03 DIAGNOSIS — Z510 Encounter for antineoplastic radiation therapy: Secondary | ICD-10-CM | POA: Diagnosis not present

## 2017-05-03 DIAGNOSIS — C44301 Unspecified malignant neoplasm of skin of nose: Secondary | ICD-10-CM | POA: Diagnosis not present

## 2017-05-05 DIAGNOSIS — C44301 Unspecified malignant neoplasm of skin of nose: Secondary | ICD-10-CM | POA: Diagnosis not present

## 2017-05-05 DIAGNOSIS — Z510 Encounter for antineoplastic radiation therapy: Secondary | ICD-10-CM | POA: Diagnosis not present

## 2017-05-09 DIAGNOSIS — C44301 Unspecified malignant neoplasm of skin of nose: Secondary | ICD-10-CM | POA: Diagnosis not present

## 2017-05-09 DIAGNOSIS — Z510 Encounter for antineoplastic radiation therapy: Secondary | ICD-10-CM | POA: Diagnosis not present

## 2017-05-11 DIAGNOSIS — Z510 Encounter for antineoplastic radiation therapy: Secondary | ICD-10-CM | POA: Diagnosis not present

## 2017-05-11 DIAGNOSIS — C44301 Unspecified malignant neoplasm of skin of nose: Secondary | ICD-10-CM | POA: Diagnosis not present

## 2017-05-12 DIAGNOSIS — C44301 Unspecified malignant neoplasm of skin of nose: Secondary | ICD-10-CM | POA: Diagnosis not present

## 2017-05-12 DIAGNOSIS — Z510 Encounter for antineoplastic radiation therapy: Secondary | ICD-10-CM | POA: Diagnosis not present

## 2017-05-13 DIAGNOSIS — C44301 Unspecified malignant neoplasm of skin of nose: Secondary | ICD-10-CM | POA: Diagnosis not present

## 2017-05-13 DIAGNOSIS — Z510 Encounter for antineoplastic radiation therapy: Secondary | ICD-10-CM | POA: Diagnosis not present

## 2017-05-16 DIAGNOSIS — Z510 Encounter for antineoplastic radiation therapy: Secondary | ICD-10-CM | POA: Diagnosis not present

## 2017-05-16 DIAGNOSIS — C44301 Unspecified malignant neoplasm of skin of nose: Secondary | ICD-10-CM | POA: Diagnosis not present

## 2017-05-17 DIAGNOSIS — C44301 Unspecified malignant neoplasm of skin of nose: Secondary | ICD-10-CM | POA: Diagnosis not present

## 2017-05-17 DIAGNOSIS — Z510 Encounter for antineoplastic radiation therapy: Secondary | ICD-10-CM | POA: Diagnosis not present

## 2017-05-18 DIAGNOSIS — C44301 Unspecified malignant neoplasm of skin of nose: Secondary | ICD-10-CM | POA: Diagnosis not present

## 2017-05-18 DIAGNOSIS — Z510 Encounter for antineoplastic radiation therapy: Secondary | ICD-10-CM | POA: Diagnosis not present

## 2017-05-19 DIAGNOSIS — Z510 Encounter for antineoplastic radiation therapy: Secondary | ICD-10-CM | POA: Diagnosis not present

## 2017-05-19 DIAGNOSIS — C44301 Unspecified malignant neoplasm of skin of nose: Secondary | ICD-10-CM | POA: Diagnosis not present

## 2017-05-20 DIAGNOSIS — Z510 Encounter for antineoplastic radiation therapy: Secondary | ICD-10-CM | POA: Diagnosis not present

## 2017-05-20 DIAGNOSIS — C44301 Unspecified malignant neoplasm of skin of nose: Secondary | ICD-10-CM | POA: Diagnosis not present

## 2017-05-23 DIAGNOSIS — Z510 Encounter for antineoplastic radiation therapy: Secondary | ICD-10-CM | POA: Diagnosis not present

## 2017-05-23 DIAGNOSIS — C44301 Unspecified malignant neoplasm of skin of nose: Secondary | ICD-10-CM | POA: Diagnosis not present

## 2017-05-24 DIAGNOSIS — C44301 Unspecified malignant neoplasm of skin of nose: Secondary | ICD-10-CM | POA: Diagnosis not present

## 2017-05-24 DIAGNOSIS — Z510 Encounter for antineoplastic radiation therapy: Secondary | ICD-10-CM | POA: Diagnosis not present

## 2017-05-25 DIAGNOSIS — C44301 Unspecified malignant neoplasm of skin of nose: Secondary | ICD-10-CM | POA: Diagnosis not present

## 2017-05-25 DIAGNOSIS — Z510 Encounter for antineoplastic radiation therapy: Secondary | ICD-10-CM | POA: Diagnosis not present

## 2017-05-26 DIAGNOSIS — Z510 Encounter for antineoplastic radiation therapy: Secondary | ICD-10-CM | POA: Diagnosis not present

## 2017-05-26 DIAGNOSIS — C44301 Unspecified malignant neoplasm of skin of nose: Secondary | ICD-10-CM | POA: Diagnosis not present

## 2017-05-27 DIAGNOSIS — Z510 Encounter for antineoplastic radiation therapy: Secondary | ICD-10-CM | POA: Diagnosis not present

## 2017-05-27 DIAGNOSIS — C44301 Unspecified malignant neoplasm of skin of nose: Secondary | ICD-10-CM | POA: Diagnosis not present

## 2017-05-30 DIAGNOSIS — Z510 Encounter for antineoplastic radiation therapy: Secondary | ICD-10-CM | POA: Diagnosis not present

## 2017-05-30 DIAGNOSIS — C44301 Unspecified malignant neoplasm of skin of nose: Secondary | ICD-10-CM | POA: Diagnosis not present

## 2017-05-31 DIAGNOSIS — Z510 Encounter for antineoplastic radiation therapy: Secondary | ICD-10-CM | POA: Diagnosis not present

## 2017-05-31 DIAGNOSIS — C44301 Unspecified malignant neoplasm of skin of nose: Secondary | ICD-10-CM | POA: Diagnosis not present

## 2017-06-01 DIAGNOSIS — C44301 Unspecified malignant neoplasm of skin of nose: Secondary | ICD-10-CM | POA: Diagnosis not present

## 2017-06-01 DIAGNOSIS — Z510 Encounter for antineoplastic radiation therapy: Secondary | ICD-10-CM | POA: Diagnosis not present

## 2017-06-02 DIAGNOSIS — Z510 Encounter for antineoplastic radiation therapy: Secondary | ICD-10-CM | POA: Diagnosis not present

## 2017-06-02 DIAGNOSIS — C44301 Unspecified malignant neoplasm of skin of nose: Secondary | ICD-10-CM | POA: Diagnosis not present

## 2017-06-03 DIAGNOSIS — Z510 Encounter for antineoplastic radiation therapy: Secondary | ICD-10-CM | POA: Diagnosis not present

## 2017-06-03 DIAGNOSIS — C44301 Unspecified malignant neoplasm of skin of nose: Secondary | ICD-10-CM | POA: Diagnosis not present

## 2017-06-03 DIAGNOSIS — E785 Hyperlipidemia, unspecified: Secondary | ICD-10-CM | POA: Diagnosis not present

## 2017-06-03 DIAGNOSIS — D509 Iron deficiency anemia, unspecified: Secondary | ICD-10-CM | POA: Diagnosis not present

## 2017-06-06 DIAGNOSIS — C44301 Unspecified malignant neoplasm of skin of nose: Secondary | ICD-10-CM | POA: Diagnosis not present

## 2017-06-06 DIAGNOSIS — Z510 Encounter for antineoplastic radiation therapy: Secondary | ICD-10-CM | POA: Diagnosis not present

## 2017-06-07 DIAGNOSIS — C44301 Unspecified malignant neoplasm of skin of nose: Secondary | ICD-10-CM | POA: Diagnosis not present

## 2017-06-07 DIAGNOSIS — Z510 Encounter for antineoplastic radiation therapy: Secondary | ICD-10-CM | POA: Diagnosis not present

## 2017-06-08 DIAGNOSIS — C44301 Unspecified malignant neoplasm of skin of nose: Secondary | ICD-10-CM | POA: Diagnosis not present

## 2017-06-08 DIAGNOSIS — Z510 Encounter for antineoplastic radiation therapy: Secondary | ICD-10-CM | POA: Diagnosis not present

## 2017-06-09 DIAGNOSIS — Z510 Encounter for antineoplastic radiation therapy: Secondary | ICD-10-CM | POA: Diagnosis not present

## 2017-06-09 DIAGNOSIS — C44301 Unspecified malignant neoplasm of skin of nose: Secondary | ICD-10-CM | POA: Diagnosis not present

## 2017-06-10 DIAGNOSIS — C44301 Unspecified malignant neoplasm of skin of nose: Secondary | ICD-10-CM | POA: Diagnosis not present

## 2017-06-10 DIAGNOSIS — Z510 Encounter for antineoplastic radiation therapy: Secondary | ICD-10-CM | POA: Diagnosis not present

## 2017-06-13 DIAGNOSIS — C44301 Unspecified malignant neoplasm of skin of nose: Secondary | ICD-10-CM | POA: Diagnosis not present

## 2017-06-13 DIAGNOSIS — Z510 Encounter for antineoplastic radiation therapy: Secondary | ICD-10-CM | POA: Diagnosis not present

## 2017-06-22 ENCOUNTER — Ambulatory Visit (INDEPENDENT_AMBULATORY_CARE_PROVIDER_SITE_OTHER): Payer: Medicare Other | Admitting: Sports Medicine

## 2017-06-22 ENCOUNTER — Encounter: Payer: Self-pay | Admitting: Sports Medicine

## 2017-06-22 DIAGNOSIS — M79672 Pain in left foot: Secondary | ICD-10-CM

## 2017-06-22 DIAGNOSIS — M79675 Pain in left toe(s): Secondary | ICD-10-CM | POA: Diagnosis not present

## 2017-06-22 DIAGNOSIS — M79671 Pain in right foot: Secondary | ICD-10-CM

## 2017-06-22 DIAGNOSIS — L97511 Non-pressure chronic ulcer of other part of right foot limited to breakdown of skin: Secondary | ICD-10-CM

## 2017-06-22 DIAGNOSIS — B351 Tinea unguium: Secondary | ICD-10-CM

## 2017-06-22 DIAGNOSIS — M79674 Pain in right toe(s): Secondary | ICD-10-CM | POA: Diagnosis not present

## 2017-06-22 DIAGNOSIS — L97521 Non-pressure chronic ulcer of other part of left foot limited to breakdown of skin: Secondary | ICD-10-CM

## 2017-06-22 NOTE — Progress Notes (Signed)
Patient ID: ITXEL WICKARD, female   DOB: 01-08-1920, 84 y.o.   MRN: [id number] Subjective: SARA KEYS is a 84 y.o. female patient who presents to office for evaluation of right greater than left foot pain secondary to ulceration and secondary to elongated toenails that are thick and hard to cut. 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 Denies constitutional symptoms. Patient denies any other pedal complaints.   Patient Active Problem List   Diagnosis Date Noted  . LBBB (left bundle branch block) 10/19/2016  . Hypertensive heart disease with heart failure ([place]) 08/31/2015  . Non-rheumatic mitral regurgitation 08/31/2015  . AI (aortic incompetence) 08/31/2015  . 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 Multiple pulmonary nodules 03/27/2012  . Hiatal hernia   . Rheumatoid arthritis(714.0)   . Hypertension   . Hyperlipidemia   . Osteoporosis   . CAD (coronary artery disease)   . Chronic diastolic heart failure ([place])     Current Outpatient Medications on File Prior to Visit  Medication Sig Dispense Refill  . aspirin EC 81 MG tablet Take 81 mg by mouth daily.    Marland Kitchen atorvastatin (LIPITOR) 20 MG tablet Take 1 tablet (20 mg total) by mouth daily. 30 tablet 11  . bethanechol (URECHOLINE) 25 MG tablet Take 25 mg by mouth 3 (three) times daily.    . Calcium-Vitamin D 600-125 MG-UNIT TABS Take by mouth daily.    . Cholecalciferol (VITAMIN D-3) 1000 units CAPS Take 1,000 Units by mouth daily.    . cyclobenzaprine (FLEXERIL) 10 MG tablet Take 10 mg by mouth at bedtime.     Marland Kitchen denosumab (PROLIA) 60 MG/ML SOLN injection Inject 60 mg into the skin every 6 (six) months.    . ferrous gluconate (FERGON) 325 MG tablet Take 325 mg by mouth daily with breakfast.     . folic acid (FOLVITE) 1 MG tablet Take 1 mg by mouth daily.    .  furosemide (LASIX) 40 MG tablet Take 40 mg by mouth daily. Takes 80 mg 2 - 3 times per week    . HYDROcodone-acetaminophen (NORCO) 10-325 MG per tablet Take 1 tablet by mouth every 6 (six) hours as needed.    Marland Kitchen Lysine HCl 500 MG TABS Take by mouth daily.    . methotrexate (RHEUMATREX) 2.5 MG tablet Take 2.5 mg by mouth as directed. Take 6 tablets weekly    . Omega-3 Fatty Acids (FISH OIL) 1000 MG CAPS Take by mouth daily.    Marland Kitchen omeprazole (PRILOSEC) 20 MG capsule Take 20 mg by mouth daily.    . polyethylene glycol powder (CLEARLAX) powder Take 1 Container by mouth daily as needed.    . potassium chloride SA (K-DUR,KLOR-CON) 20 MEQ tablet Take 20 mEq by mouth daily.     . predniSONE (DELTASONE) 5 MG tablet Take 5 mg by mouth daily.     . ranitidine (ZANTAC) 150 MG tablet Take 150 mg by mouth 2 (two) times daily.     No current facility-administered medications on file prior to visit.     Allergies  Allergen Reactions  . Iodinated Diagnostic Agents   . Iohexol      Code: HIVES, Desc: PT DEVELOPED HIVES AFTER GETTING CONTRAST MEDIA;THIS OCCURED "SEVERAL" YRS. AGO.CT NOTIFIED 03/05/09!, Onset Date: [id number]     Objective:  General: Alert and oriented x3  in no acute distress  Dermatology: Keratotic lesion present Sub-met 1 on left.  There is a small pinpoint ulceration with granular base to right sub met 1 and at the left third toe. There is no drainage or no infection. Mild pain is present with direct pressure to right greater than left, no webspace macerations, no ecchymosis bilateral, all nails x 9 are mildly elongated and thickened.  Vascular: Dorsalis Pedis 1/4 and Posterior Tibial pedal pulses 0/4, Capillary Fill Time 5 seconds, - pedal hair growth bilateral, no edema bilateral lower extremities, mild varicosities bilateral, Temperature gradient within normal limits.  Neurology: Johney Maine sensation intact via light touch bilateral.  Musculoskeletal: Mild tenderness with palpation on right  greater than left foot at area of concern, Muscular strength 4/5 in all groups without pain or limitation on range of motion. + bunion and hammertoes deformities, with fat pad atrophy noted bilateral.  Assessment and Plan: Problem List Items Addressed This Visit    None    Visit Diagnoses    Skin ulcer of right foot, limited to breakdown of skin (HCC)    -  Primary   Skin ulcer of third toe of left foot, limited to breakdown of skin (HCC)       Foot pain, left       Foot pain, right       Pain due to onychomycosis of toenails of both feet         -Complete examination performed -Discussed treatment options for wound care and for thickened nails -Cleansed areas and applied Betadine and bandaid advised daughter to do the same daily until area is healed -Advised daughter to closely monitor these areas if worsen to call for antibiotic -May leave open to air at night -Advised patient to replace old and worn shoes because likely these could be rubbing causing the wounds appearing -Continue with Tylenol as needed -Mechanically debrided all nails using a sterile nail nipper without incident -Patient to return to office in no better within 2-3 weeks or sooner if condition worsens.  If wounds are still present will order ABI testing to check circulation to make sure that these are not vascular induced wounds as well.  Landis Martins, DPM

## 2017-07-13 ENCOUNTER — Encounter: Payer: Self-pay | Admitting: Sports Medicine

## 2017-07-13 ENCOUNTER — Telehealth: Payer: Self-pay | Admitting: *Deleted

## 2017-07-13 ENCOUNTER — Telehealth: Payer: Self-pay | Admitting: Sports Medicine

## 2017-07-13 ENCOUNTER — Ambulatory Visit (INDEPENDENT_AMBULATORY_CARE_PROVIDER_SITE_OTHER): Payer: Medicare Other | Admitting: Sports Medicine

## 2017-07-13 DIAGNOSIS — C44301 Unspecified malignant neoplasm of skin of nose: Secondary | ICD-10-CM | POA: Diagnosis not present

## 2017-07-13 DIAGNOSIS — I739 Peripheral vascular disease, unspecified: Secondary | ICD-10-CM

## 2017-07-13 DIAGNOSIS — L97511 Non-pressure chronic ulcer of other part of right foot limited to breakdown of skin: Secondary | ICD-10-CM

## 2017-07-13 DIAGNOSIS — L97521 Non-pressure chronic ulcer of other part of left foot limited to breakdown of skin: Secondary | ICD-10-CM

## 2017-07-13 MED ORDER — MEDIHONEY WOUND/BURN DRESSING EX GEL: CUTANEOUS | 0 refills | Status: DC

## 2017-07-13 MED ORDER — MEDIHONEY WOUND/BURN DRESSING EX GEL: CUTANEOUS | 0 refills | Status: AC

## 2017-07-13 NOTE — Progress Notes (Signed)
Patient ID: QUINNLEY COLASURDO, female   DOB: 28-Oct-1919, 84 y.o.   MRN: [id number] Subjective: PENNE ROSENSTOCK is a 84 y.o. female patient who returns to office for follow-up evaluation of right and left foot ulcerations.  Daughter has been using iodine to area as an offloading when in bed to prevent rubbing or irritation to toes.  Patient reports that she has pain with light pressure or touch to these wounds. Denies constitutional symptoms like nausea, vomiting, fever, chills, night sweats, chest pain. Patient denies any other pedal complaints.   Patient Active Problem List   Diagnosis Date Noted  . LBBB (left bundle branch block) 10/19/2016  . Hypertensive heart disease with heart failure ([place]) 08/31/2015  . Non-rheumatic mitral regurgitation 08/31/2015  . AI (aortic incompetence) 08/31/2015  . 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 Multiple pulmonary nodules 03/27/2012  . Hiatal hernia   . Rheumatoid arthritis(714.0)   . Hypertension   . Hyperlipidemia   . Osteoporosis   . CAD (coronary artery disease)   . Chronic diastolic heart failure ([place])     Current Outpatient Medications on File Prior to Visit  Medication Sig Dispense Refill  . aspirin EC 81 MG tablet Take 81 mg by mouth daily.    Marland Kitchen atorvastatin (LIPITOR) 20 MG tablet Take 1 tablet (20 mg total) by mouth daily. 30 tablet 11  . bethanechol (URECHOLINE) 25 MG tablet Take 25 mg by mouth 3 (three) times daily.    . Calcium-Vitamin D 600-125 MG-UNIT TABS Take by mouth daily.    . Cholecalciferol (VITAMIN D-3) 1000 units CAPS Take 1,000 Units by mouth daily.    . cyclobenzaprine (FLEXERIL) 10 MG tablet Take 10 mg by mouth at bedtime.     Marland Kitchen denosumab (PROLIA) 60 MG/ML SOLN injection Inject 60 mg into the skin every 6 (six) months.    . ferrous gluconate (FERGON) 325 MG tablet Take 325 mg by mouth daily with breakfast.     . folic acid (FOLVITE) 1 MG tablet Take 1 mg by mouth daily.    . furosemide (LASIX) 40 MG tablet Take 40 mg by mouth  daily. Takes 80 mg 2 - 3 times per week    . HYDROcodone-acetaminophen (NORCO) 10-325 MG per tablet Take 1 tablet by mouth every 6 (six) hours as needed.    Marland Kitchen Lysine HCl 500 MG TABS Take by mouth daily.    . methotrexate (RHEUMATREX) 2.5 MG tablet Take 2.5 mg by mouth as directed. Take 6 tablets weekly    . Omega-3 Fatty Acids (FISH OIL) 1000 MG CAPS Take by mouth daily.    Marland Kitchen omeprazole (PRILOSEC) 20 MG capsule Take 20 mg by mouth daily.    . polyethylene glycol powder (CLEARLAX) powder Take 1 Container by mouth daily as needed.    . potassium chloride SA (K-DUR,KLOR-CON) 20 MEQ tablet Take 20 mEq by mouth daily.     . predniSONE (DELTASONE) 5 MG tablet Take 5 mg by mouth daily.     . ranitidine (ZANTAC) 150 MG tablet Take 150 mg by mouth 2 (two) times daily.     No current facility-administered medications on file prior to visit.     Allergies  Allergen Reactions  . Iodinated Diagnostic Agents   . Iohexol      Code: HIVES, Desc: PT DEVELOPED HIVES AFTER GETTING CONTRAST MEDIA;THIS OCCURED "SEVERAL" YRS. AGO.CT NOTIFIED 03/05/09!, Onset Date: 13244010     Objective:  General: Alert and oriented x3 in no acute distress  Dermatology: Keratotic  lesion present Sub-met 1 on left.  There is a small pinpoint ulceration with granular base to right sub met 1 and at the left third toe and right second toe.  There is also an ulceration at the medial aspect of the first metatarsal phalangeal joint that measures 2 cm x 0.5 cm does not probe to bone with a fibrotic base, there is mild blanchable erythema and edema no other acute signs of infection, mild pain is present with direct pressure to right greater than left, no webspace macerations, no ecchymosis bilateral, all nails x 9 are short and thickened.  Vascular: Dorsalis Pedis 1/4 and Posterior Tibial pedal pulses 0/4, Capillary Fill Time 5 seconds, - pedal hair growth bilateral, no edema bilateral lower extremities, mild varicosities bilateral,  Temperature gradient within normal limits.  Neurology: Johney Maine sensation intact via light touch bilateral.  Musculoskeletal: Mild tenderness with palpation on right greater than left foot at area of concern at ulceration, Muscular strength 4/5 in all groups without pain or limitation on range of motion. + bunion and hammertoes deformities, with fat pad atrophy noted bilateral.  Assessment and Plan: Problem List Items Addressed This Visit    None    Visit Diagnoses    Skin ulcer of right foot, limited to breakdown of skin ([place])    -  Primary   Skin ulcer of second toe of right foot, limited to breakdown of skin ([facility])       Skin ulcer of third toe of left foot, limited to breakdown of skin (HCC)       PVD (peripheral vascular disease) ([place])         -Complete examination performed -Discussed treatment options for wound care -Cleansed areas and applied medi honey and bandaid advised daughter to do the same daily; prescription sent to pharmacy advised daughter that if prescription is too costly will send an alternative of Silvadene cream -Advised daughter to closely monitor these areas if worsen to call for antibiotic -May leave open to air at night -Advised patient to replace old and worn shoes because likely these could be rubbing causing the wounds appearing as previously recommended -Continue with Tylenol as needed for pain -Ordered ABI testing to further evaluate circulation in the setting of increased pain with wounds -Patient to return to office in 2-3 weeks or sooner if condition worsens.   Landis Martins, DPM

## 2017-07-13 NOTE — Telephone Encounter (Signed)
-----   Message from Landis Martins, [place] sent at 07/13/2017  3:07 PM EDT ----- Regarding: ABIs Painful toe ulcerations bilateral with history of PAD -Dr. Chauncey Cruel

## 2017-07-13 NOTE — Telephone Encounter (Signed)
Sent!

## 2017-07-13 NOTE — Telephone Encounter (Signed)
Pt called to have RX sent to [facility] in [place]. She said it was too expensive at [facility], it was $200.00.

## 2017-07-14 ENCOUNTER — Telehealth: Payer: Self-pay | Admitting: Sports Medicine

## 2017-07-14 NOTE — Telephone Encounter (Signed)
I informed pt's dtr, Kathline Magic of pt's doppler appt at [facility] 07/21/2017 at 3:00pm for 2:30pm arrival.

## 2017-07-14 NOTE — Telephone Encounter (Signed)
[name] scheduled pt for 07/21/2017 arrive 2:30pm for 3:00pm dopplers. Faxed orders to [facility].

## 2017-07-14 NOTE — Telephone Encounter (Signed)
I spoke with pt's dtr, Kathline Magic and informed I had pt's appt for circulation testing and she stated she was coming out of the beauty salon and could not write the appt, and would call me.

## 2017-07-14 NOTE — Telephone Encounter (Signed)
Pt. Daughter [name] Parsons was returning your call about her mother. She asked that you please call her back.

## 2017-07-21 DIAGNOSIS — I70203 Unspecified atherosclerosis of native arteries of extremities, bilateral legs: Secondary | ICD-10-CM | POA: Diagnosis not present

## 2017-07-21 DIAGNOSIS — E785 Hyperlipidemia, unspecified: Secondary | ICD-10-CM | POA: Diagnosis not present

## 2017-07-21 DIAGNOSIS — L97521 Non-pressure chronic ulcer of other part of left foot limited to breakdown of skin: Secondary | ICD-10-CM | POA: Diagnosis not present

## 2017-07-21 DIAGNOSIS — I70213 Atherosclerosis of native arteries of extremities with intermittent claudication, bilateral legs: Secondary | ICD-10-CM | POA: Diagnosis not present

## 2017-07-21 DIAGNOSIS — L97511 Non-pressure chronic ulcer of other part of right foot limited to breakdown of skin: Secondary | ICD-10-CM | POA: Diagnosis not present

## 2017-07-21 NOTE — Telephone Encounter (Signed)
Faxed orders to [facility]. 

## 2017-07-29 ENCOUNTER — Encounter: Payer: Self-pay | Admitting: Sports Medicine

## 2017-07-29 ENCOUNTER — Ambulatory Visit (INDEPENDENT_AMBULATORY_CARE_PROVIDER_SITE_OTHER): Payer: Medicare Other | Admitting: Sports Medicine

## 2017-07-29 VITALS — BP 128/67 | HR 100 | Temp 98.4°F

## 2017-07-29 DIAGNOSIS — I739 Peripheral vascular disease, unspecified: Secondary | ICD-10-CM | POA: Diagnosis not present

## 2017-07-29 DIAGNOSIS — M79671 Pain in right foot: Secondary | ICD-10-CM | POA: Diagnosis not present

## 2017-07-29 DIAGNOSIS — M79672 Pain in left foot: Secondary | ICD-10-CM | POA: Diagnosis not present

## 2017-07-29 DIAGNOSIS — L97521 Non-pressure chronic ulcer of other part of left foot limited to breakdown of skin: Secondary | ICD-10-CM | POA: Diagnosis not present

## 2017-07-29 DIAGNOSIS — L97511 Non-pressure chronic ulcer of other part of right foot limited to breakdown of skin: Secondary | ICD-10-CM | POA: Diagnosis not present

## 2017-07-29 NOTE — Progress Notes (Signed)
Patient ID: VYLET MAFFIA, female   DOB: [birth year], 84 y.o.   MRN: [id number] Subjective: [name] is a 84 y.o. female patient who returns to office for follow-up evaluation of right and left foot ulcerations.  Daughter has been using medihoney to areas and has purchased new shoes to prevent rubbing and irritation to toes.  Admits to pain to the lesions even with light pressure touch. Denies constitutional symptoms like nausea, vomiting, fever, chills, night sweats, chest pain. Patient is also here for review of vascular studies and denies any other pedal complaints.   Patient Active Problem List   Diagnosis Date Noted  . LBBB (left bundle branch block) 10/19/2016  . Hypertensive heart disease with heart failure ([place]) 08/31/2015  . Non-rheumatic mitral regurgitation 08/31/2015  . AI (aortic incompetence) 08/31/2015  . 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 Multiple pulmonary nodules 03/27/2012  . Hiatal hernia   . Rheumatoid arthritis(714.0)   . Hypertension   . Hyperlipidemia   . Osteoporosis   . CAD (coronary artery disease)   . Chronic diastolic heart failure ([place])     Current Outpatient Medications on File Prior to Visit  Medication Sig Dispense Refill  . aspirin EC 81 MG tablet Take 81 mg by mouth daily.    Marland Kitchen atorvastatin (LIPITOR) 20 MG tablet Take 1 tablet (20 mg total) by mouth daily. 30 tablet 11  . bethanechol (URECHOLINE) 25 MG tablet Take 25 mg by mouth 3 (three) times daily.    . Calcium-Vitamin D 600-125 MG-UNIT TABS Take by mouth daily.    . Cholecalciferol (VITAMIN D-3) 1000 units CAPS Take 1,000 Units by mouth daily.    . cyclobenzaprine (FLEXERIL) 10 MG tablet Take 10 mg by mouth at bedtime.     Marland Kitchen denosumab (PROLIA) 60 MG/ML SOLN injection Inject 60 mg into the skin every 6 (six) months.    . ferrous gluconate (FERGON) 325 MG tablet Take 325 mg by mouth daily with breakfast.     . folic acid (FOLVITE) 1 MG tablet Take 1 mg by mouth daily.    . furosemide (LASIX) 40  MG tablet Take 40 mg by mouth daily. Takes 80 mg 2 - 3 times per week    . HYDROcodone-acetaminophen (NORCO) 10-325 MG per tablet Take 1 tablet by mouth every 6 (six) hours as needed.    Marland Kitchen Lysine HCl 500 MG TABS Take by mouth daily.    . methotrexate (RHEUMATREX) 2.5 MG tablet Take 2.5 mg by mouth as directed. Take 6 tablets weekly    . Omega-3 Fatty Acids (FISH OIL) 1000 MG CAPS Take by mouth daily.    Marland Kitchen omeprazole (PRILOSEC) 20 MG capsule Take 20 mg by mouth daily.    . polyethylene glycol powder (CLEARLAX) powder Take 1 Container by mouth daily as needed.    . potassium chloride SA (K-DUR,KLOR-CON) 20 MEQ tablet Take 20 mEq by mouth daily.     . predniSONE (DELTASONE) 5 MG tablet Take 5 mg by mouth daily.     . ranitidine (ZANTAC) 150 MG tablet Take 150 mg by mouth 2 (two) times daily.    . Wound Dressings (MEDIHONEY WOUND/BURN DRESSING) GEL Apply to wounds every other day 44 mL 0   No current facility-administered medications on file prior to visit.     Allergies  Allergen Reactions  . Iodinated Diagnostic Agents   . Iohexol      Code: HIVES, Desc: PT DEVELOPED HIVES AFTER GETTING CONTRAST MEDIA;THIS OCCURED "SEVERAL" YRS. AGO.CT NOTIFIED  03/05/09!, Onset Date: [id number]     Objective:  General: Alert and oriented x3 in no acute distress  Dermatology: There is a small fiber granular partial-thickness ulceration at right sub met 1 and left sub-met 1 measures less than 1 cm on the right and less than 0.5 cm on the left.  There is also an ulceration at the distal tuft of the right second toe with a fibronecrotic base, there is mild blanchable erythema and edema no other acute signs of infection, mild pain is present with direct pressure to right greater than left, no webspace macerations, no ecchymosis bilateral, all nails x 9 are short and thickened.  Vascular: Dorsalis Pedis 1/4 and Posterior Tibial pedal pulses 0/4, Capillary Fill Time 5 seconds, - pedal hair growth bilateral, no edema  bilateral lower extremities, mild varicosities bilateral, Temperature gradient within normal limits.  Neurology: Johney Maine sensation intact via light touch bilateral.  Musculoskeletal: Mild tenderness with palpation on right greater than left foot at area of concern at ulcerations, Muscular strength 4/5 in all groups without pain or limitation on range of motion. + bunion and hammertoes deformities, with fat pad atrophy noted bilateral.  Assessment and Plan: Problem List Items Addressed This Visit    None    Visit Diagnoses    Skin ulcer of right foot, limited to breakdown of skin (HCC)    -  Primary   Skin ulcer of foot, left, limited to breakdown of skin (HCC)       PVD (peripheral vascular disease) (HCC)       Foot pain, left       Foot pain, right         -Complete examination performed -Discussed review results from ABI and duplex studies: Patient has significant occlusive vascular disease I expressed the patient and daughter that I am unsure if anything more can be done for her especially at her age and with her overall health status of congestive heart failure and allergy to ionated contrast.  I advised patient that we can put in a consult to interventional radiology to have Dr. Damita Dunnings take a look at her to assess her risks to see if it is necessary to move forward with a CT ANGIO: Patient and daughter wants to have this consult -Discussed treatment options for wound care -Cleansed areas and applied medi honey and bandaid advised daughter to do the same daily -Advised daughter to closely monitor these areas if worsen to call for antibiotic -May leave open to air at night -Advised patient and daughter patient may benefit from hyperbaric oxygen treatments of which we can arrange at [facility] wound care center once she has seen the vascular doctor -Continue with Tylenol as needed for pain -Patient to return to office in 2-3 weeks or after vascular appointment or sooner if condition  worsens.   Landis Martins, DPM

## 2017-08-02 ENCOUNTER — Other Ambulatory Visit: Payer: Self-pay | Admitting: Sports Medicine

## 2017-08-02 ENCOUNTER — Telehealth: Payer: Self-pay | Admitting: *Deleted

## 2017-08-02 DIAGNOSIS — I739 Peripheral vascular disease, unspecified: Secondary | ICD-10-CM

## 2017-08-02 DIAGNOSIS — L97521 Non-pressure chronic ulcer of other part of left foot limited to breakdown of skin: Secondary | ICD-10-CM

## 2017-08-02 DIAGNOSIS — M7989 Other specified soft tissue disorders: Secondary | ICD-10-CM | POA: Diagnosis not present

## 2017-08-02 DIAGNOSIS — R0600 Dyspnea, unspecified: Secondary | ICD-10-CM | POA: Diagnosis not present

## 2017-08-02 DIAGNOSIS — M79661 Pain in right lower leg: Secondary | ICD-10-CM | POA: Diagnosis not present

## 2017-08-02 DIAGNOSIS — M25551 Pain in right hip: Secondary | ICD-10-CM | POA: Diagnosis not present

## 2017-08-02 DIAGNOSIS — L97511 Non-pressure chronic ulcer of other part of right foot limited to breakdown of skin: Secondary | ICD-10-CM

## 2017-08-02 NOTE — Telephone Encounter (Signed)
[facility]-Interventional Radiology states Dr. Earleen Newport will be in office tomorrow and she will call pt to be scheduled for tomorrow at 1:00pm.

## 2017-08-02 NOTE — Telephone Encounter (Signed)
-----   Message from [name], [place] sent at 07/29/2017  5:19 PM EDT ----- Regarding: Consult to IR Vascular Consult to see Dr. Dorthula Rue Patient would like to discuss with vascular doctor the extent of her severe arterial disease to see if anything can be reasonably done at her age before getting a CT angiogram

## 2017-08-02 NOTE — Telephone Encounter (Signed)
Thanks

## 2017-08-02 NOTE — Telephone Encounter (Signed)
[place] states she spoke with pt's sister, and they are trying to get pt in to see Dr. Earleen Newport in [facility], due to pt's severe hip pain.

## 2017-08-03 ENCOUNTER — Encounter: Payer: Self-pay | Admitting: Radiology

## 2017-08-03 ENCOUNTER — Other Ambulatory Visit: Payer: Self-pay | Admitting: *Deleted

## 2017-08-03 ENCOUNTER — Ambulatory Visit
Admission: RE | Admit: 2017-08-03 | Discharge: 2017-08-03 | Disposition: A | Discharge status: Home / Self Care | Payer: Medicare Other | Source: Ambulatory Visit | Attending: Sports Medicine | Admitting: Sports Medicine

## 2017-08-03 ENCOUNTER — Other Ambulatory Visit: Payer: Self-pay | Admitting: Interventional Radiology

## 2017-08-03 DIAGNOSIS — L97511 Non-pressure chronic ulcer of other part of right foot limited to breakdown of skin: Secondary | ICD-10-CM

## 2017-08-03 DIAGNOSIS — I998 Other disorder of circulatory system: Secondary | ICD-10-CM

## 2017-08-03 DIAGNOSIS — L97521 Non-pressure chronic ulcer of other part of left foot limited to breakdown of skin: Secondary | ICD-10-CM

## 2017-08-03 DIAGNOSIS — I70229 Atherosclerosis of native arteries of extremities with rest pain, unspecified extremity: Secondary | ICD-10-CM

## 2017-08-03 DIAGNOSIS — I739 Peripheral vascular disease, unspecified: Secondary | ICD-10-CM

## 2017-08-03 HISTORY — PX: IR RADIOLOGIST EVAL & MGMT: IMG5224

## 2017-08-03 NOTE — Consult Note (Addendum)
Chief Complaint: I have a foot wound  Referring Physician(s): Dr. Landis Martins  History of Present Illness: [name] Parsons is a 84 y.o. female presenting today for a scheduled [facility], kindly referred by Dr. Cannon Kettle, for evaluation of endovascular candidacy for improving blood flow to her feet, given bilateral foot wounds.    [name] Parsons is here today with her daughter, who is her primary care-giver.  They tell me that both feet have wounds, but the right is worst.  This wound on the right foot has been present for at least several weeks, to possibly months, but was not present at the beginning of the year.  She has associated redness of the forefoot, which is worsening. She has been receiving advanced wound care with Dr. Cannon Kettle, and her daughter takes care of the foot at her home.    She denies any resting pain, and says that there is no pain at night that wakes her.  She does ambulate when she is at home.  She lives by herself, although her daughter does visit her everyday to take care of her.  Her daughter is preparing her meals and helps her clean.  The patient does let me know that she is active during the day and is up and not in bed.  They go to dinner at least twice per week together and with friends, and she is playing BINGO at least once per week.  She is very active still.    She has no recent hospitalization.  She is currently not taking any antibiotics.   She has a history of CHF, but no prior heart attack or stroke.  She has no orthopnea, and sleeps on her back with one pillow.    Her PCP is Dr. Delena Bali, and she sees him routinely every 3 months.    Her CV risk factors are: Age, HTN, HLD, known CAD.   Past Medical History:  Diagnosis Date  . AI (aortic incompetence) 08/31/2015   Overview:  mild to mod   . Anemia, iron deficiency   . CAD (coronary artery disease)   . Congestive heart failure ([place])   . Depression   .  Diastolic heart failure ([place])   . GERD (gastroesophageal reflux disease)   . Hiatal hernia   . Hx of colonic polyps   . Hyperlipidemia   . Hypertension   . Hypertensive heart disease with heart failure ([place]) 08/31/2015  . Hyponatremia   . Hypopotassemia   . Hypopotassemia   . Hypotension   . Multiple pulmonary nodules 03/27/2012  . Non-rheumatic mitral regurgitation 08/31/2015   Overview:  Mild   . Osteoarthrosis, hand   . Osteoporosis   . Rheumatoid arthritis(714.0)   . Spondylosis, lumbosacral     Past Surgical History:  Procedure Laterality Date  . APPENDECTOMY    . CATARACT EXTRACTION     insert prosthetic lens  . CHOLECYSTECTOMY    . hyperplastic polyp removed  201   Dr Lyda Jester  . LAMINECTOMY     lumber with fusion  . spinal stenosis surgery  2004  . TOTAL ABDOMINAL HYSTERECTOMY      Allergies: Iodinated diagnostic agents and Iohexol  Medications: Prior to Admission medications   Medication Sig Start Date End Date Taking? Authorizing Provider  aspirin EC 81 MG tablet Take 81 mg by mouth daily.    [provider]  atorvastatin (LIPITOR) 20 MG tablet Take 1 tablet (20 mg total) by mouth  daily. 05/03/12   Tommy Medal, Lavell Islam, MD  bethanechol (URECHOLINE) 25 MG tablet Take 25 mg by mouth 3 (three) times daily.    [provider]  Calcium-Vitamin D 600-125 MG-UNIT TABS Take by mouth daily.    [provider]  Cholecalciferol (VITAMIN D-3) 1000 units CAPS Take 1,000 Units by mouth daily.    [provider]  cyclobenzaprine (FLEXERIL) 10 MG tablet Take 10 mg by mouth at bedtime.  03/24/12   [provider]  denosumab (PROLIA) 60 MG/ML SOLN injection Inject 60 mg into the skin every 6 (six) months.    [provider]  ferrous gluconate (FERGON) 325 MG tablet Take 325 mg by mouth daily with breakfast.     [provider]  folic acid (FOLVITE) 1 MG tablet Take 1 mg by mouth daily.    [provider]    furosemide (LASIX) 40 MG tablet Take 40 mg by mouth daily. Takes 80 mg 2 - 3 times per week    [provider]  HYDROcodone-acetaminophen (NORCO) 10-325 MG per tablet Take 1 tablet by mouth every 6 (six) hours as needed.    [provider]  Lysine HCl 500 MG TABS Take by mouth daily.    [provider]  methotrexate (RHEUMATREX) 2.5 MG tablet Take 2.5 mg by mouth as directed. Take 6 tablets weekly    [provider]  Omega-3 Fatty Acids (FISH OIL) 1000 MG CAPS Take by mouth daily.    [provider]  omeprazole (PRILOSEC) 20 MG capsule Take 20 mg by mouth daily.    [provider]  polyethylene glycol powder (CLEARLAX) powder Take 1 Container by mouth daily as needed.    [provider]  potassium chloride SA (K-DUR,KLOR-CON) 20 MEQ tablet Take 20 mEq by mouth daily.  03/10/12   [provider]  predniSONE (DELTASONE) 5 MG tablet Take 5 mg by mouth daily.  03/24/12   [provider]  ranitidine (ZANTAC) 150 MG tablet Take 150 mg by mouth 2 (two) times daily.    [provider]  Wound Dressings ([place] WOUND/BURN DRESSING) GEL Apply to wounds every other day 07/13/17   Landis Martins, DPM     Family History  Problem Relation Age of Onset  . Diabetes Mother   . Diabetes Sister   . Diabetes Maternal Grandfather   . Heart failure Brother     Social History   Socioeconomic History  . Marital status: Widowed    Spouse name: Not on file  . Number of children: Not on file  . Years of education: Not on file  . Highest education level: Not on file  Occupational History  . Occupation: retired  Scientific laboratory technician  . Financial resource strain: Not on file  . Food insecurity:    Worry: Not on file    Inability: Not on file  . Transportation needs:    Medical: Not on file    Non-medical: Not on file  Tobacco Use  . Smoking status: Never Smoker  . Smokeless tobacco: Never Used  Substance and Sexual Activity   . Alcohol use: No  . Drug use: No  . Sexual activity: Not on file  Lifestyle  . Physical activity:    Days per week: Not on file    Minutes per session: Not on file  . Stress: Not on file  Relationships  . Social connections:    Talks on phone: Not on file    Gets together:  Not on file    Attends religious service: Not on file    Active member of club or organization: Not on file    Attends meetings of clubs or organizations: Not on file    Relationship status: Not on file  Other Topics Concern  . Not on file  Social History Narrative  . Not on file       Review of Systems: A 12 point ROS discussed and pertinent positives are indicated in the HPI above.  All other systems are negative.  Review of Systems  Vital Signs: BP 110/66   Pulse 88   Temp 97.6 F (36.4 C) (Oral)   Resp 14   Ht 4\' 11"  (1.499 m)   Wt 98 lb (44.5 kg)   SpO2 97%   BMI 19.79 kg/m   Physical Exam General: 84 yo female appearing   stated age.  Well-developed, well-nourished.  No distress.  In wheelchair.  HEENT: Atraumatic, normocephalic.  Conjugate gaze, extra-ocular motor intact. No scleral icterus or scleral injection. No lesions on external ears, nose, lips, or gums.  Oral mucosa moist, pink.  Neck: Symmetric with no goiter enlargement.  Chest/Lungs:  Symmetric chest with inspiration/expiration.  No labored breathing.  Clear to auscultation with no wheezes, rhonchi, or rales.  Heart:  RRR, with no third heart sounds appreciated. No JVD appreciated.  Abdomen:  Soft, NT/ND, with + bowel sounds.   Genito-urinary: Deferred Neurologic: Alert & Oriented to person, place, and time.   Normal affect and insight.  Appropriate questions.  Moving all 4 extremities with gross sensory intact.  Pulse Exam:  Non palpable bilateral PT and DP pulses.  Doppler signal is identified in bilateral PT and DP, monophasic.  Venous signal identified.     Extremities: Rubor of the right forefoot.  Quarter-sized wound of  the right foot at the first metatarsal head.  Deep wound margin, with drainage/necrotic tissue centrally and margin of erythema.  Second wound of the distal second toe.   Less pronounced rubor of the left forefoot.  There is a shallow ulcer of dime-size at the first metatarsal head, with no drainage or central necrosis.  There is a second wound at the pad of the left foot under the fourth toe, about 29mmx3mm, with no drainage.    Mallampati Score:  Imaging: No results found.  Labs:  CBC: No results for input(s): WBC, HGB, HCT, PLT in the last 8760 hours.  COAGS: No results for input(s): INR, APTT in the last 8760 hours.  BMP: No results for input(s): NA, K, CL, CO2, GLUCOSE, BUN, CALCIUM, CREATININE, GFRNONAA, GFRAA in the last 8760 hours.  Invalid input(s): CMP  LIVER FUNCTION TESTS: No results for input(s): BILITOT, AST, ALT, ALKPHOS, PROT, ALBUMIN in the last 8760 hours.  TUMOR MARKERS: No results for input(s): AFPTM, CEA, CA199, CHROMGRNA in the last 8760 hours.  Assessment and Plan:  [name] Parsons is a 84 year old female with bilateral lower extremity CLI, [place] 5 category symptoms of the bilateral feet.  The right is worst than the left.    She has recent non-invasive exam demonstrating significant arterial occlusive disease, however difficult to objectively categorize given non-compressible vessels.  She has monophasic signal at the bilateral ankle DP and PT.    On the right, using the Vascular Surgery WIfI matrix, I would classify her as clinical stage 4, high risk for amputation.  (assuming severe ischemia)  On the left, using the Vascular Surgery WIfI matrix, I would classify her  as clinical stage 3, moderate risk.   (assuming severe ischemia)  I had a lengthy discussion with [name] Parsons and her daughter about the anatomy, pathology/pathophysiology, and the treatment options for PAD/CLI.  I emphasized the baseline therapy of maximal medical management, which she is  currently using.  Beyond that surgical bypass and endovascular options may play a role, particularly with CLI.    I did give her my impression about her risk for amputation, now worst on the right foot.  We weigh that risk and the sequela that may result including prolonged hospitalization, poor healing, poor recovery with loss of function, against any risks of the procedure to improve blood flow.  Specific risks that I discussed regarding the procedure include:  bleeding, infection, contrast reaction, renal injury/nephropathy, arterial injury/dissection, distal embolization, need for additional procedure/surgery, worsening symptoms/tissue including limb loss, hospitalization, cardiopulmonary collapse, death.    [name] Parsons and her daughter understand the options, and they are interested in pursuing endovascular options for attempt at restoring blood flow for tissue salvage.    At this point, we will have a CTA and runoff performed for operative planning, with plan for future endovascular approach to the right leg, at [place]  They understand our time-table and treatment plan.   Plan: -Cross-sectional anatomic imaging with CTA run-off.  She will need a 13-hour standard prep for a history of iodinated agent-related reaction.   -Plan is to proceed with aorto-peripheral angiogram and possible intervention, targeting the right leg before considering the left, which is improving. -Continue maximal medical therapy for cardiovascular risk reduction, including anti-platelet therapy.    ______________________________________________________________   1Morley Kos MD, et al. 2016 AHA/ACC Guideline on the Management of Patients With Lower Extremity Peripheral Artery Disease: Executive Summary: A Report of the American College of Cardiology/American Heart Association Task Force on Clinical Practice Guidelines. J Am Coll Cardiol. 2017 Mar 21;69(11):1465-1508. doi: 10.1016/j.jacc.2016.11.008.   2  - Norgren L, et al. TASC II Working Group. Inter-society consensus for the management of peripheral arterial disease. Int Tressia Miners. 2007 Jun;26(2):81-157. Review. PubMed PMID: 27035009  3 - Hingorani A, et al. The management of diabetic foot: A clinical practice guideline by the Society for Vascular Surgery in collaboration with the [place] and the Society  for Vascular Medicine. J Vasc Surg. 2016 Feb;63(2 Suppl):3S-21S. doi: 10.1016/j.jvs.2015.10.003. PubMed PMID: 38182993.  Thank you for this interesting consult.  I greatly enjoyed meeting [name] Parsons and look forward to participating in their care.  A copy of this report was sent to the requesting provider on this date.  Electronically Signed: Corrie Mckusick 08/03/2017, 2:53 PM   I spent a total of  40 Minutes   in face to face in clinical consultation, greater than 50% of which was counseling/coordinating care for bilateral critical limb ischemia, possible aortic and peripheral angiogram, with endovascular approach for restoring blood flow.

## 2017-08-04 ENCOUNTER — Other Ambulatory Visit: Payer: Self-pay | Admitting: Radiology

## 2017-08-04 ENCOUNTER — Telehealth: Payer: Self-pay | Admitting: Radiology

## 2017-08-04 MED ORDER — PREDNISONE 50 MG PO TABS: ORAL_TABLET | ORAL | 0 refills | Status: AC

## 2017-08-04 MED ORDER — DIPHENHYDRAMINE HCL 50 MG PO TABS: 50.0000 mg | ORAL_TABLET | ORAL | 0 refills | Status: AC

## 2017-08-04 NOTE — Telephone Encounter (Signed)
[name] Parsons (pt's daughter) called with the following app't information:  CTA:  2017/08/14 at 1 p at [place] at 12:30 pm.  No solid foods x 4 hr prior.  Liquids and routine medications are allowed.    Instructions given for 13 hr prep.  Rx phoned in to [facility].  [name] Riki Rusk, RN 08/04/2017 10:19 AM

## 2017-08-06 DIAGNOSIS — R404 Transient alteration of awareness: Secondary | ICD-10-CM | POA: Diagnosis not present

## 2017-08-06 DIAGNOSIS — S91101D Unspecified open wound of right great toe without damage to nail, subsequent encounter: Secondary | ICD-10-CM | POA: Diagnosis not present

## 2017-08-06 DIAGNOSIS — I214 Non-ST elevation (NSTEMI) myocardial infarction: Secondary | ICD-10-CM | POA: Diagnosis not present

## 2017-08-06 DIAGNOSIS — R061 Stridor: Secondary | ICD-10-CM | POA: Diagnosis not present

## 2017-08-06 DIAGNOSIS — R0602 Shortness of breath: Secondary | ICD-10-CM | POA: Diagnosis not present

## 2017-08-06 DIAGNOSIS — S91301A Unspecified open wound, right foot, initial encounter: Secondary | ICD-10-CM | POA: Diagnosis not present

## 2017-08-06 DIAGNOSIS — R531 Weakness: Secondary | ICD-10-CM | POA: Diagnosis not present

## 2017-08-07 DIAGNOSIS — K219 Gastro-esophageal reflux disease without esophagitis: Secondary | ICD-10-CM | POA: Diagnosis present

## 2017-08-07 DIAGNOSIS — Z7401 Bed confinement status: Secondary | ICD-10-CM | POA: Diagnosis not present

## 2017-08-07 DIAGNOSIS — R871 Abnormal level of hormones in specimens from female genital organs: Secondary | ICD-10-CM | POA: Diagnosis not present

## 2017-08-07 DIAGNOSIS — R0602 Shortness of breath: Secondary | ICD-10-CM | POA: Diagnosis not present

## 2017-08-07 DIAGNOSIS — M2550 Pain in unspecified joint: Secondary | ICD-10-CM | POA: Diagnosis not present

## 2017-08-07 DIAGNOSIS — Z8659 Personal history of other mental and behavioral disorders: Secondary | ICD-10-CM | POA: Diagnosis not present

## 2017-08-07 DIAGNOSIS — I5043 Acute on chronic combined systolic (congestive) and diastolic (congestive) heart failure: Secondary | ICD-10-CM | POA: Diagnosis not present

## 2017-08-07 DIAGNOSIS — Z91041 Radiographic dye allergy status: Secondary | ICD-10-CM | POA: Diagnosis not present

## 2017-08-07 DIAGNOSIS — E785 Hyperlipidemia, unspecified: Secondary | ICD-10-CM | POA: Diagnosis present

## 2017-08-07 DIAGNOSIS — Z862 Personal history of diseases of the blood and blood-forming organs and certain disorders involving the immune mechanism: Secondary | ICD-10-CM | POA: Diagnosis not present

## 2017-08-07 DIAGNOSIS — N183 Chronic kidney disease, stage 3 (moderate): Secondary | ICD-10-CM | POA: Diagnosis present

## 2017-08-07 DIAGNOSIS — F0390 Unspecified dementia without behavioral disturbance: Secondary | ICD-10-CM | POA: Diagnosis present

## 2017-08-07 DIAGNOSIS — Z87898 Personal history of other specified conditions: Secondary | ICD-10-CM | POA: Diagnosis not present

## 2017-08-07 DIAGNOSIS — S91101D Unspecified open wound of right great toe without damage to nail, subsequent encounter: Secondary | ICD-10-CM | POA: Diagnosis not present

## 2017-08-07 DIAGNOSIS — R0600 Dyspnea, unspecified: Secondary | ICD-10-CM | POA: Diagnosis not present

## 2017-08-07 DIAGNOSIS — I2510 Atherosclerotic heart disease of native coronary artery without angina pectoris: Secondary | ICD-10-CM | POA: Diagnosis not present

## 2017-08-07 DIAGNOSIS — I214 Non-ST elevation (NSTEMI) myocardial infarction: Secondary | ICD-10-CM | POA: Diagnosis not present

## 2017-08-07 DIAGNOSIS — S91301A Unspecified open wound, right foot, initial encounter: Secondary | ICD-10-CM | POA: Diagnosis not present

## 2017-08-07 DIAGNOSIS — Z8679 Personal history of other diseases of the circulatory system: Secondary | ICD-10-CM | POA: Diagnosis not present

## 2017-08-07 DIAGNOSIS — Z8719 Personal history of other diseases of the digestive system: Secondary | ICD-10-CM | POA: Diagnosis not present

## 2017-08-07 DIAGNOSIS — I739 Peripheral vascular disease, unspecified: Secondary | ICD-10-CM | POA: Diagnosis present

## 2017-08-07 DIAGNOSIS — Z8589 Personal history of malignant neoplasm of other organs and systems: Secondary | ICD-10-CM | POA: Diagnosis not present

## 2017-08-07 DIAGNOSIS — Z7982 Long term (current) use of aspirin: Secondary | ICD-10-CM | POA: Diagnosis not present

## 2017-08-07 DIAGNOSIS — L89152 Pressure ulcer of sacral region, stage 2: Secondary | ICD-10-CM | POA: Diagnosis not present

## 2017-08-07 DIAGNOSIS — E871 Hypo-osmolality and hyponatremia: Secondary | ICD-10-CM | POA: Diagnosis present

## 2017-08-07 DIAGNOSIS — Z515 Encounter for palliative care: Secondary | ICD-10-CM | POA: Diagnosis present

## 2017-08-07 DIAGNOSIS — M069 Rheumatoid arthritis, unspecified: Secondary | ICD-10-CM | POA: Diagnosis not present

## 2017-08-07 DIAGNOSIS — Z8709 Personal history of other diseases of the respiratory system: Secondary | ICD-10-CM | POA: Diagnosis not present

## 2017-08-07 DIAGNOSIS — J449 Chronic obstructive pulmonary disease, unspecified: Secondary | ICD-10-CM | POA: Diagnosis not present

## 2017-08-07 DIAGNOSIS — L03115 Cellulitis of right lower limb: Secondary | ICD-10-CM | POA: Diagnosis not present

## 2017-08-07 DIAGNOSIS — I130 Hypertensive heart and chronic kidney disease with heart failure and stage 1 through stage 4 chronic kidney disease, or unspecified chronic kidney disease: Secondary | ICD-10-CM | POA: Diagnosis not present

## 2017-08-07 DIAGNOSIS — Z79899 Other long term (current) drug therapy: Secondary | ICD-10-CM | POA: Diagnosis not present

## 2017-08-07 DIAGNOSIS — Z8739 Personal history of other diseases of the musculoskeletal system and connective tissue: Secondary | ICD-10-CM | POA: Diagnosis not present

## 2017-08-07 DIAGNOSIS — R531 Weakness: Secondary | ICD-10-CM | POA: Diagnosis not present

## 2017-08-07 DIAGNOSIS — Z66 Do not resuscitate: Secondary | ICD-10-CM | POA: Diagnosis present

## 2017-08-17 ENCOUNTER — Telehealth: Payer: Self-pay | Admitting: *Deleted

## 2017-08-17 NOTE — Telephone Encounter (Signed)
Daughter phoned to let us know patient passed away on 08-24-2022.

## 2017-08-17 NOTE — Telephone Encounter (Signed)
FYI

## 2017-09-05 DEATH — deceased
